# Patient Record
Sex: Female | Born: 1995 | Race: White | Hispanic: No | Marital: Single | Smoking: Never smoker
Health system: Southern US, Community
[De-identification: ages and names within clinical notes are randomized; demographics above are authoritative.]

## PROBLEM LIST (undated history)

## (undated) DIAGNOSIS — F329 Major depressive disorder, single episode, unspecified: Secondary | ICD-10-CM

## (undated) DIAGNOSIS — F32A Depression, unspecified: Secondary | ICD-10-CM

---
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
## 2016-09-06 NOTE — L&D Delivery Note (Signed)
Patient is 21 y.o. [id number] admitted for IOL for GHTN.   Delivery Note At [phone] a viable female was delivered via  SVD, Presentation: cephalic,LOA. APGAR: 8,9 ; weight pending.   Placenta status: spontaneous, intact. Cord: 3 vessel  Anesthesia:  epidural Episiotomy:  none Lacerations:  none Suture Repair: n/a Est. Blood Loss (mL): 600  Given methergine IM x1 and cytotec buccal x 1. Bleeding resolved and fundus firm.  Mom to postpartum.  Baby to Couplet care / Skin to Skin.  Rolm BookbinderAmber Tarius Stangelo, DO [facility]

## 2017-04-08 ENCOUNTER — Emergency Department (HOSPITAL_COMMUNITY)
Admission: EM | Admit: 2017-04-08 | Discharge: 2017-04-08 | Disposition: A | Discharge status: Home / Self Care | Payer: 59 | Attending: Emergency Medicine | Admitting: Emergency Medicine

## 2017-04-08 ENCOUNTER — Encounter (HOSPITAL_COMMUNITY): Payer: Self-pay

## 2017-04-08 ENCOUNTER — Emergency Department (HOSPITAL_COMMUNITY): Payer: 59

## 2017-04-08 DIAGNOSIS — B9689 Other specified bacterial agents as the cause of diseases classified elsewhere: Secondary | ICD-10-CM | POA: Diagnosis not present

## 2017-04-08 DIAGNOSIS — D72829 Elevated white blood cell count, unspecified: Secondary | ICD-10-CM | POA: Diagnosis not present

## 2017-04-08 DIAGNOSIS — R102 Pelvic and perineal pain: Secondary | ICD-10-CM

## 2017-04-08 DIAGNOSIS — O219 Vomiting of pregnancy, unspecified: Secondary | ICD-10-CM

## 2017-04-08 DIAGNOSIS — N390 Urinary tract infection, site not specified: Secondary | ICD-10-CM | POA: Insufficient documentation

## 2017-04-08 DIAGNOSIS — Z79899 Other long term (current) drug therapy: Secondary | ICD-10-CM | POA: Insufficient documentation

## 2017-04-08 DIAGNOSIS — D649 Anemia, unspecified: Secondary | ICD-10-CM | POA: Insufficient documentation

## 2017-04-08 DIAGNOSIS — N760 Acute vaginitis: Secondary | ICD-10-CM | POA: Diagnosis not present

## 2017-04-08 DIAGNOSIS — Z3A16 16 weeks gestation of pregnancy: Secondary | ICD-10-CM | POA: Insufficient documentation

## 2017-04-08 DIAGNOSIS — O26892 Other specified pregnancy related conditions, second trimester: Secondary | ICD-10-CM

## 2017-04-08 DIAGNOSIS — O26899 Other specified pregnancy related conditions, unspecified trimester: Secondary | ICD-10-CM

## 2017-04-08 LAB — CBC WITH DIFFERENTIAL/PLATELET
Basophils Absolute: 0 10*3/uL (ref 0.0–0.1)
Basophils Relative: 0 %
EOS ABS: 0.4 10*3/uL (ref 0.0–0.7)
Eosinophils Relative: 3 %
HEMATOCRIT: 34.5 % — AB (ref 36.0–46.0)
HEMOGLOBIN: 11.3 g/dL — AB (ref 12.0–15.0)
Lymphocytes Relative: 14 %
Lymphs Abs: 1.8 10*3/uL (ref 0.7–4.0)
MCH: 25.6 pg — AB (ref 26.0–34.0)
MCHC: 32.8 g/dL (ref 30.0–36.0)
MCV: 78.1 fL (ref 78.0–100.0)
MONO ABS: 1.2 10*3/uL — AB (ref 0.1–1.0)
MONOS PCT: 9 %
NEUTROS ABS: 9.7 10*3/uL — AB (ref 1.7–7.7)
NEUTROS PCT: 74 %
Platelets: 248 10*3/uL (ref 150–400)
RBC: 4.42 MIL/uL (ref 3.87–5.11)
RDW: 14.9 % (ref 11.5–15.5)
WBC: 13 10*3/uL — ABNORMAL HIGH (ref 4.0–10.5)

## 2017-04-08 LAB — COMPREHENSIVE METABOLIC PANEL
ALK PHOS: 63 U/L (ref 38–126)
ALT: 59 U/L — ABNORMAL HIGH (ref 14–54)
ANION GAP: 10 (ref 5–15)
AST: 41 U/L (ref 15–41)
Albumin: 3.2 g/dL — ABNORMAL LOW (ref 3.5–5.0)
BUN: 6 mg/dL (ref 6–20)
CALCIUM: 8.8 mg/dL — AB (ref 8.9–10.3)
CO2: 23 mmol/L (ref 22–32)
Chloride: 103 mmol/L (ref 101–111)
Creatinine, Ser: 0.5 mg/dL (ref 0.44–1.00)
GFR calc non Af Amer: >60 mL/min (ref >60)
GLUCOSE: 87 mg/dL (ref 65–99)
Potassium: 3.7 mmol/L (ref 3.5–5.1)
SODIUM: 136 mmol/L (ref 135–145)
TOTAL PROTEIN: 7.3 g/dL (ref 6.5–8.1)
Total Bilirubin: 0.1 mg/dL — ABNORMAL LOW (ref 0.3–1.2)

## 2017-04-08 LAB — URINALYSIS, ROUTINE W REFLEX MICROSCOPIC
BACTERIA UA: NONE SEEN
Bilirubin Urine: NEGATIVE
Glucose, UA: NEGATIVE mg/dL
Hgb urine dipstick: NEGATIVE
Ketones, ur: NEGATIVE mg/dL
Nitrite: NEGATIVE
PROTEIN: NEGATIVE mg/dL
Specific Gravity, Urine: 1.026 (ref 1.005–1.030)
pH: 5 (ref 5.0–8.0)

## 2017-04-08 LAB — WET PREP, GENITAL
Sperm: NONE SEEN
TRICH WET PREP: NONE SEEN
YEAST WET PREP: NONE SEEN

## 2017-04-08 LAB — LIPASE, BLOOD: Lipase: 18 U/L (ref 11–51)

## 2017-04-08 LAB — I-STAT BETA HCG BLOOD, ED (MC, WL, AP ONLY)

## 2017-04-08 LAB — OB RESULTS CONSOLE GC/CHLAMYDIA: CHLAMYDIA, DNA PROBE: POSITIVE

## 2017-04-08 LAB — HCG, QUANTITATIVE, PREGNANCY: HCG, BETA CHAIN, QUANT, S: 10369 m[IU]/mL — AB (ref <5)

## 2017-04-08 MED ORDER — CEPHALEXIN 500 MG PO CAPS: 500.0000 mg | ORAL_CAPSULE | Freq: Two times a day (BID) | ORAL | 0 refills | Status: AC

## 2017-04-08 MED ORDER — AZITHROMYCIN 250 MG PO TABS
1000.0000 mg | ORAL_TABLET | Freq: Once | ORAL | Status: AC
  Administered 2017-04-08: 1000 mg via ORAL
  Filled 2017-04-08: qty 4

## 2017-04-08 MED ORDER — METRONIDAZOLE 0.75 % VA GEL: 1.0000 | Freq: Every day | VAGINAL | 0 refills | Status: AC

## 2017-04-08 MED ORDER — DEXTROSE 5 % IV SOLN
1.0000 g | Freq: Once | INTRAVENOUS | Status: AC
  Administered 2017-04-08: 1 g via INTRAVENOUS
  Filled 2017-04-08: qty 10

## 2017-04-08 MED ORDER — PRENATAL COMPLETE 14-0.4 MG PO TABS: 1.0000 | ORAL_TABLET | Freq: Every day | ORAL | 2 refills | Status: AC

## 2017-04-08 NOTE — ED Triage Notes (Addendum)
Patient c/o bilateral lower abdominal pain x 1 week. Patient denies any vaginal discharge, vomiting, or fever. Patient does c/o intermittent nausea. Patient is approx [redacted] weeks pregnant.

## 2017-04-08 NOTE — Discharge Instructions (Signed)
Your work up today revealed that you may have a urinary tract infection; take antibiotic as directed, until completed; stay well hydrated. Your vaginal swab also revealed bacterial vaginosis, use vaginal cream as directed to treat this. The pain you're having could be from infection, or could be from round ligament pain which is pain of a ligament of your uterus that happens as the baby grows and things in your uterus stretch. It's important that you allow your pelvis to rest, so avoid sexual intercourse until the symptoms improve. Use tylenol as needed for pain. Stay very well hydrated and get plenty of rest. Start taking prenatal vitamins.   Also, you have been treated for gonorrhea and chlamydia in the ER but the hospital will call you if lab is positive. You were tested for HIV and Syphilis, and the hospital will call you if the lab is positive. NO SEXUAL INTERCOURSE FOR AT LEAST 10 DAYS AFTER TODAY'S VISIT, THIS WILL INVALIDATE YOUR TREATMENT HERE. DO NOT ENGAGE IN SEXUAL ACTIVITY UNTIL YOU FIND OUT ABOUT YOUR RESULTS AND HAVE PARTNERS TESTED AND TREATED. ALL PARTNERS MUST BE TESTED AND TREATED FOR STD'S. ALWAYS USE CONDOMS WHEN ENGAGING IN INTERCOURSE. [facility] for future [facility]D concerns or screenings.  Follow up with the women's outpatient clinic in 5-7 days for recheck of symptoms and to establish prenatal care. [facility]) for any changes or worsening symptoms.

## 2017-04-08 NOTE — ED Provider Notes (Signed)
[facility]-EMERGENCY DEPT Provider Note   CSN: [id number] Arrival date & time: 04/08/17  1514     History   Chief Complaint Chief Complaint  Patient presents with  . Abdominal Pain    [redacted] weeks pregnant    HPI Kaitlyn Bailey is a 21 y.o. G2P0010 female currently ~246w4d pregnant based on LMP of approximately 12/20/16, with no known PMHx, and no current prenatal or primary care, who presents to the ED with complaints of intermittent lower abdominal pain 1 week with associated intermittent nausea. Patient states that she does not currently have a primary or prenatal care established, and that she went to an urgent care a few weeks ago and was told that she was pregnant, at this point she states she's about [redacted] weeks pregnant. She noticed that over the last 1 week she has had intermittent lower abdominal pain, although currently denies any ongoing pain at this moment. She describes the pain as 10/10 intermittent sharp and pressure-like lower abdominal pain that sometimes radiates to the left lower back, worse with bending or leaning forward, and mildly improved with Tylenol and ibuprofen. She states that she also gets randomly nauseous intermittently, but not only in the mornings and with no seeming association to anything specific, and currently this is also not ongoing. She is sexually active with one female partner, unprotected. She denies fevers, chills, CP, SOB, vomiting, diarrhea/constipation, obstipation, melena, hematochezia, hematuria, dysuria, vaginal bleeding/discharge, genital sores, genital itching, myalgias, arthralgias, numbness, tingling, focal weakness, or any other complaints at this time. Denies recent travel, sick contacts, suspicious food intake, EtOH use, NSAID use, or prior abd surgeries. She has no known PMHx to her knowledge, but she doesn't follow with any PCP care so she isn't totally sure. She doesn't remember ever being told her BP was high.    The history is provided by the  patient and medical records. No language interpreter was used.  Abdominal Pain   This is a new problem. The current episode started more than 2 days ago. Episode frequency: intermittently. The problem has not changed since onset.The pain is associated with an unknown factor. The pain is located in the suprapubic region, RLQ and LLQ. The quality of the pain is pressure-like and sharp. The pain is at a severity of 10/10. The pain is moderate. Associated symptoms include nausea. Pertinent negatives include fever, diarrhea, hematochezia, melena, vomiting, constipation, dysuria, hematuria, arthralgias and myalgias. The symptoms are aggravated by certain positions. The symptoms are relieved by acetaminophen and NSAIDs.    History reviewed. No pertinent past medical history.  There are no active problems to display for this patient.   History reviewed. No pertinent surgical history.  OB History    Gravida Para Term Preterm AB Living   1             SAB TAB Ectopic Multiple Live Births                   Home Medications    Prior to Admission medications   Medication Sig Start Date End Date Taking? Authorizing Provider  acetaminophen-codeine (TYLENOL #3) 300-30 MG per tablet Take 1 tablet by mouth every 4 (four) hours as needed for pain. 05/25/12   Moreno-Coll, Adlih, MD  amoxicillin (AMOXIL) 500 MG capsule Take 2 capsules (1,000 mg total) by mouth 2 (two) times daily. 07/25/13   Rodolph Bongorey, Evan S, MD  ibuprofen (ADVIL,MOTRIN) 600 MG tablet Take 1 tablet (600 mg total) by mouth every 8 (eight) hours  as needed for pain. 05/25/12   Moreno-Coll, Adlih, MD    Family History No family history on file.  Social History Social History  Substance Use Topics  . Smoking status: Never Smoker  . Smokeless tobacco: Never Used  . Alcohol use No     Allergies   Patient has no known allergies.   Review of Systems Review of Systems  Constitutional: Negative for chills and fever.  Respiratory:  Negative for shortness of breath.   Cardiovascular: Negative for chest pain.  Gastrointestinal: Positive for abdominal pain and nausea. Negative for blood in stool, constipation, diarrhea, hematochezia, melena and vomiting.  Genitourinary: Negative for dysuria, genital sores, hematuria, vaginal bleeding, vaginal discharge and vaginal pain.  Musculoskeletal: Negative for arthralgias and myalgias.  Skin: Negative for color change.  Allergic/Immunologic: Negative for immunocompromised state.  Neurological: Negative for weakness and numbness.  Psychiatric/Behavioral: Negative for confusion.   All other systems reviewed and are negative for acute change except as noted in the HPI.    Physical Exam Updated Vital Signs BP (S) 131/76   Pulse (S) (!) 102   Temp 98.8 F (37.1 C) (Oral)   Resp (S) 18   Ht 5\' 4"  (1.626 m)   Wt 117.9 kg (260 lb)   SpO2 (S) 100%   BMI 44.63 kg/m    Physical Exam  Constitutional: She is oriented to person, place, and time. She appears well-developed and well-nourished.  Non-toxic appearance. No distress.  Afebrile, nontoxic, NAD, obese female; BP slightly elevated 140s/40s on my recheck; mild tachycardia in low 100s  HENT:  Head: Normocephalic and atraumatic.  Mouth/Throat: Oropharynx is clear and moist and mucous membranes are normal.  Eyes: Conjunctivae and EOM are normal. Right eye exhibits no discharge. Left eye exhibits no discharge.  Neck: Normal range of motion. Neck supple.  Cardiovascular: Regular rhythm, normal heart sounds and intact distal pulses.  Tachycardia present.  Exam reveals no gallop and no friction rub.   No murmur heard. HR low 100s  Pulmonary/Chest: Effort normal and breath sounds normal. No respiratory distress. She has no decreased breath sounds. She has no wheezes. She has no rhonchi. She has no rales.  Abdominal: Soft. Normal appearance and bowel sounds are normal. She exhibits no distension. There is tenderness in the right lower  quadrant, suprapubic area and left lower quadrant. There is no rigidity, no rebound, no guarding, no CVA tenderness, no tenderness at McBurney's point and negative Murphy's sign.  Soft, obese which significantly limits exam but no obvious distension, no definite gravid uterus but hard to adequately assess due to body habitus; +BS throughout, with mild lower abd TTP L>R, no r/g/r, neg murphy's, neg mcburney's, no CVA TTP   Genitourinary: Uterus normal. Pelvic exam was performed with patient supine. There is no rash, tenderness or lesion on the right labia. There is no rash, tenderness or lesion on the left labia. Cervix exhibits discharge and friability. Cervix exhibits no motion tenderness. Right adnexum displays no mass, no tenderness and no fullness. Left adnexum displays tenderness. Left adnexum displays no mass and no fullness. No erythema, tenderness or bleeding in the vagina. Vaginal discharge found.  Genitourinary Comments: Chaperone present for exam. No rashes, lesions, or tenderness to external genitalia. No erythema, injury, or tenderness to vaginal mucosa. Mild amount of mucopurulent vaginal discharge without bleeding within vaginal vault. No adnexal masses or fullness, but with mild L adnexal TTP. No CMT, but mild cervical friability and mucopurulent discharge from cervical os. Cervical os is closed.  Uterus non-deviated, mobile, nonTTP, and without definite enlargement however body habitus significantly limits this portion of the exam.   Musculoskeletal: Normal range of motion.  Neurological: She is alert and oriented to person, place, and time. She has normal strength. No sensory deficit.  Skin: Skin is warm, dry and intact. No rash noted.  Psychiatric: She has a normal mood and affect.  Nursing note and vitals reviewed.    ED Treatments / Results  Labs (all labs ordered are listed, but only abnormal results are displayed) Labs Reviewed  WET PREP, GENITAL - Abnormal; Notable for the  following:       Result Value   Clue Cells Wet Prep HPF POC PRESENT (*)    WBC, Wet Prep HPF POC MANY (*)    All other components within normal limits  CBC WITH DIFFERENTIAL/PLATELET - Abnormal; Notable for the following:    WBC 13.0 (*)    Hemoglobin 11.3 (*)    HCT 34.5 (*)    MCH 25.6 (*)    Neutro Abs 9.7 (*)    Monocytes Absolute 1.2 (*)    All other components within normal limits  COMPREHENSIVE METABOLIC PANEL - Abnormal; Notable for the following:    Calcium 8.8 (*)    Albumin 3.2 (*)    ALT 59 (*)    Total Bilirubin 0.1 (*)    All other components within normal limits  URINALYSIS, ROUTINE W REFLEX MICROSCOPIC - Abnormal; Notable for the following:    APPearance CLOUDY (*)    Leukocytes, UA LARGE (*)    Squamous Epithelial / LPF 6-30 (*)    All other components within normal limits  HCG, QUANTITATIVE, PREGNANCY - Abnormal; Notable for the following:    hCG, Beta Chain, Quant, S 10,369 (*)    All other components within normal limits  I-STAT BETA HCG BLOOD, ED ([facility], [facility], [facility] ONLY) - Abnormal; Notable for the following:    I-stat hCG, quantitative >2,000.0 (*)    All other components within normal limits  URINE CULTURE  LIPASE, BLOOD  RPR  HIV ANTIBODY (ROUTINE TESTING)  GC/CHLAMYDIA PROBE AMP ([employer]) NOT AT [facility]    EKG  EKG Interpretation None       Radiology [place] Ob Limited  Result Date: 04/08/2017 CLINICAL DATA:  Pelvic pain and cramping for 1 week. Fifteen weeks and 4 days pregnant by last menstrual period. EXAM: LIMITED TRANSABDOMINAL AND TRANSVAGINAL OBSTETRIC ULTRASOUND AND DOPPLER FINDINGS: Number of Fetuses: 1 Heart Rate:  149 bpm Movement: Visualized Presentation: Breech Placental Location: Anterior Previa: No Amniotic Fluid (Subjective):  Within normal limits. BPD:  3.9cm 17w  6d MATERNAL FINDINGS: Cervix:  Appears closed. Uterus/Adnexae: No abnormality visualized. Right ovarian corpus luteum cyst. Pulsed Doppler evaluation of both ovaries demonstrates  normal low-resistance arterial and venous waveforms. Other findings No free fluid. IMPRESSION: Single live intrauterine gestation with an estimated gestational age by BPD of 17 weeks and 6 days. No complicating features. This exam is performed on an emergent basis and does not comprehensively evaluate fetal size, dating, or anatomy; follow-up complete OB US should be considered if further fetal assessment is warranted. Electronically Signed   By: Beckie SaltsSteven  Reid M.D.   On: 04/08/2017 20:48   [place] Ob Transvaginal  Result Date: 04/08/2017 CLINICAL DATA:  Pelvic pain and cramping for 1 week. Fifteen weeks and 4 days pregnant by last menstrual period. EXAM: LIMITED TRANSABDOMINAL AND TRANSVAGINAL OBSTETRIC ULTRASOUND AND DOPPLER FINDINGS: Number of Fetuses: 1 Heart Rate:  149 bpm Movement:  Visualized Presentation: Breech Placental Location: Anterior Previa: No Amniotic Fluid (Subjective):  Within normal limits. BPD:  3.9cm 17w  6d MATERNAL FINDINGS: Cervix:  Appears closed. Uterus/Adnexae: No abnormality visualized. Right ovarian corpus luteum cyst. Pulsed Doppler evaluation of both ovaries demonstrates normal low-resistance arterial and venous waveforms. Other findings No free fluid. IMPRESSION: Single live intrauterine gestation with an estimated gestational age by BPD of 17 weeks and 6 days. No complicating features. This exam is performed on an emergent basis and does not comprehensively evaluate fetal size, dating, or anatomy; follow-up complete OB US should be considered if further fetal assessment is warranted. Electronically Signed   By: Beckie Salts M.D.   On: 04/08/2017 20:48   [place] Art/ven Flow Abd Pelv Doppler  Result Date: 04/08/2017 CLINICAL DATA:  Pelvic pain and cramping for 1 week. Fifteen weeks and 4 days pregnant by last menstrual period. EXAM: LIMITED TRANSABDOMINAL AND TRANSVAGINAL OBSTETRIC ULTRASOUND AND DOPPLER FINDINGS: Number of Fetuses: 1 Heart Rate:  149 bpm Movement: Visualized Presentation:  Breech Placental Location: Anterior Previa: No Amniotic Fluid (Subjective):  Within normal limits. BPD:  3.9cm 17w  6d MATERNAL FINDINGS: Cervix:  Appears closed. Uterus/Adnexae: No abnormality visualized. Right ovarian corpus luteum cyst. Pulsed Doppler evaluation of both ovaries demonstrates normal low-resistance arterial and venous waveforms. Other findings No free fluid. IMPRESSION: Single live intrauterine gestation with an estimated gestational age by BPD of 17 weeks and 6 days. No complicating features. This exam is performed on an emergent basis and does not comprehensively evaluate fetal size, dating, or anatomy; follow-up complete OB US should be considered if further fetal assessment is warranted. Electronically Signed   By: Beckie Salts M.D.   On: 04/08/2017 20:48    Procedures Procedures (including critical care time)  Medications Ordered in ED Medications  azithromycin (ZITHROMAX) tablet 1,000 mg (1,000 mg Oral Given 04/08/17 1953)  cefTRIAXone (ROCEPHIN) 1 g in dextrose 5 % 50 mL IVPB (0 g Intravenous Stopped 04/08/17 2025)     Initial Impression / Assessment and Plan / ED Course  I have reviewed the triage vital signs and the nursing notes.  Pertinent labs & imaging results that were available during my care of the patient were reviewed by me and considered in my medical decision making (see chart for details).     21 y.o. female here with intermittent lower abd pain x1wk, currently ~[redacted]w[redacted]d pregnant by approximate LMP. No prenatal care yet. On exam, obese female which slightly limits exam, mild lower abd TTP L>R but nonperitoneal and hard to say whether her uterus is palpable due to body habitus. BP 147/40 on my recheck, and 130s/70s on second recheck; HR mildly elevated in the low 100s. Neg murphy's exam, neg mcburney's point tenderness. Will get labs, STD check, and perform pelvic exam, then likely get pelvic U/S. Pt declined wanting anything for pain or nausea. Will reassess  shortly.   7:38 PM CBC w/diff with mild leukocytosis and mild anemia. Remainder of labs pending. Pelvic exam reveals mild amount of mucopurulent discharge from the cervix, mild cervical friability, no CMT, mild L adnexal TTP; will empirically cover for GC/CT, and proceed with U/S. Will reassess shortly.   10:54 PM CMP essentially unremarkable. Lipase WNL. U/A with large leuks, nitrite neg, TNTC WBC, but no bacteria and 6-30 squamous so it's highly likely that it's just contaminated, however given lower abd pain, and the fact that she's pregnant, will treat empirically; UCx sent. Quant BetaHCG 10,369. Wet prep showing +clue cells and many  WBCs but otherwise neg for yeast/trich; will treat for BV given her discharge. U/S reveals single live IUP with EGA [redacted]w[redacted]d, no other concerning findings. Overall, it's possible this is round ligament pain, vs UTI vs BV/infection. Will empirically cover for UTI and BV. Advised tylenol use, pelvic rest, stay hydrated, abstinence until STD testing returns, and start prenatals; f/up with women's clinic for ongoing prenatal care and recheck of symptoms in 5-7 days. Strict return precautions advised. I explained the diagnosis and have given explicit precautions to return to the ER including for any other new or worsening symptoms. The patient understands and accepts the medical plan as it's been dictated and I have answered their questions. Discharge instructions concerning home care and prescriptions have been given. The patient is STABLE and is discharged to home in good condition.    Final Clinical Impressions(s) / ED Diagnoses   Final diagnoses:  Left adnexal tenderness  Pelvic pain in pregnancy, antepartum, second trimester  Nausea/vomiting in pregnancy  Anemia, unspecified type  Leukocytosis, unspecified type  BV (bacterial vaginosis)  Lower urinary tract infectious disease    New Prescriptions New Prescriptions   CEPHALEXIN (KEFLEX) 500 MG CAPSULE    Take 1  capsule (500 mg total) by mouth 2 (two) times daily. x 7 days   METRONIDAZOLE (METROGEL VAGINAL) 0.75 % VAGINAL GEL    Place 1 Applicatorful vaginally at bedtime. x7 days   PRENATAL VIT-FE FUMARATE-FA (PRENATAL COMPLETE) 14-0.4 MG TABS    Take 1 tablet by mouth daily after breakfast.     [address] [place], PA-C 04/08/17 2300    Nira Conn, MD 04/09/17 [phone]

## 2017-04-08 NOTE — ED Notes (Signed)
EDPA Provider at bedside. 

## 2017-04-09 LAB — HIV ANTIBODY (ROUTINE TESTING W REFLEX): HIV SCREEN 4TH GENERATION: NONREACTIVE

## 2017-04-09 LAB — RPR: RPR Ser Ql: NONREACTIVE

## 2017-04-10 LAB — URINE CULTURE

## 2017-04-11 LAB — GC/CHLAMYDIA PROBE AMP (~~LOC~~) NOT AT ARMC
CHLAMYDIA, DNA PROBE: NEGATIVE
NEISSERIA GONORRHEA: POSITIVE — AB

## 2017-06-20 LAB — OB RESULTS CONSOLE RUBELLA ANTIBODY, IGM: RUBELLA: IMMUNE

## 2017-06-20 LAB — OB RESULTS CONSOLE GC/CHLAMYDIA
Chlamydia: NEGATIVE
Gonorrhea: NEGATIVE

## 2017-06-20 LAB — OB RESULTS CONSOLE VARICELLA ZOSTER ANTIBODY, IGG: VARICELLA IGG: IMMUNE

## 2017-07-15 ENCOUNTER — Other Ambulatory Visit (HOSPITAL_COMMUNITY): Payer: Self-pay | Admitting: Nurse Practitioner

## 2017-07-15 DIAGNOSIS — Z3689 Encounter for other specified antenatal screening: Secondary | ICD-10-CM

## 2017-07-18 ENCOUNTER — Ambulatory Visit (HOSPITAL_COMMUNITY)
Admission: RE | Admit: 2017-07-18 | Discharge: 2017-07-18 | Disposition: A | Discharge status: Home / Self Care | Payer: 59 | Source: Ambulatory Visit | Attending: Nurse Practitioner | Admitting: Nurse Practitioner

## 2017-07-18 DIAGNOSIS — Z363 Encounter for antenatal screening for malformations: Secondary | ICD-10-CM | POA: Insufficient documentation

## 2017-07-18 DIAGNOSIS — Z3A32 32 weeks gestation of pregnancy: Secondary | ICD-10-CM | POA: Diagnosis not present

## 2017-07-18 DIAGNOSIS — Z3689 Encounter for other specified antenatal screening: Secondary | ICD-10-CM

## 2017-08-18 LAB — OB RESULTS CONSOLE GBS: STREP GROUP B AG: NEGATIVE

## 2017-08-25 ENCOUNTER — Encounter (HOSPITAL_COMMUNITY): Payer: Self-pay

## 2017-08-25 ENCOUNTER — Other Ambulatory Visit: Payer: Self-pay

## 2017-08-25 ENCOUNTER — Inpatient Hospital Stay (HOSPITAL_COMMUNITY)
Admission: AD | Admit: 2017-08-25 | Discharge: 2017-08-29 | DRG: 807 | Disposition: A | Discharge status: Home / Self Care | Payer: 59 | Source: Ambulatory Visit | Attending: Obstetrics and Gynecology | Admitting: Obstetrics and Gynecology

## 2017-08-25 DIAGNOSIS — O134 Gestational [pregnancy-induced] hypertension without significant proteinuria, complicating childbirth: Secondary | ICD-10-CM | POA: Diagnosis present

## 2017-08-25 DIAGNOSIS — Z3A37 37 weeks gestation of pregnancy: Secondary | ICD-10-CM

## 2017-08-25 DIAGNOSIS — O99214 Obesity complicating childbirth: Secondary | ICD-10-CM | POA: Diagnosis present

## 2017-08-25 DIAGNOSIS — O133 Gestational [pregnancy-induced] hypertension without significant proteinuria, third trimester: Secondary | ICD-10-CM

## 2017-08-25 HISTORY — DX: Depression, unspecified: F32.A

## 2017-08-25 HISTORY — DX: Major depressive disorder, single episode, unspecified: F32.9

## 2017-08-25 LAB — PROTEIN / CREATININE RATIO, URINE
Creatinine, Urine: 132 mg/dL
PROTEIN CREATININE RATIO: 0.08 mg/mg{creat} (ref 0.00–0.15)
TOTAL PROTEIN, URINE: 10 mg/dL

## 2017-08-25 LAB — TYPE AND SCREEN
ABO/RH(D): B POS
Antibody Screen: NEGATIVE

## 2017-08-25 LAB — COMPREHENSIVE METABOLIC PANEL
ALK PHOS: 100 U/L (ref 38–126)
ALT: 25 U/L (ref 14–54)
ANION GAP: 10 (ref 5–15)
AST: 26 U/L (ref 15–41)
Albumin: 2.5 g/dL — ABNORMAL LOW (ref 3.5–5.0)
BILIRUBIN TOTAL: 0.5 mg/dL (ref 0.3–1.2)
BUN: 6 mg/dL (ref 6–20)
CALCIUM: 8.7 mg/dL — AB (ref 8.9–10.3)
CO2: 20 mmol/L — AB (ref 22–32)
CREATININE: 0.49 mg/dL (ref 0.44–1.00)
Chloride: 108 mmol/L (ref 101–111)
Glucose, Bld: 79 mg/dL (ref 65–99)
Potassium: 3.7 mmol/L (ref 3.5–5.1)
SODIUM: 138 mmol/L (ref 135–145)
TOTAL PROTEIN: 6.2 g/dL — AB (ref 6.5–8.1)

## 2017-08-25 LAB — CBC
HEMATOCRIT: 35.8 % — AB (ref 36.0–46.0)
HEMOGLOBIN: 11.5 g/dL — AB (ref 12.0–15.0)
MCH: 25.5 pg — AB (ref 26.0–34.0)
MCHC: 32.1 g/dL (ref 30.0–36.0)
MCV: 79.4 fL (ref 78.0–100.0)
Platelets: 268 10*3/uL (ref 150–400)
RBC: 4.51 MIL/uL (ref 3.87–5.11)
RDW: 16.6 % — ABNORMAL HIGH (ref 11.5–15.5)
WBC: 13.2 10*3/uL — ABNORMAL HIGH (ref 4.0–10.5)

## 2017-08-25 LAB — URINALYSIS, ROUTINE W REFLEX MICROSCOPIC
Bilirubin Urine: NEGATIVE
GLUCOSE, UA: NEGATIVE mg/dL
Hgb urine dipstick: NEGATIVE
KETONES UR: NEGATIVE mg/dL
Leukocytes, UA: NEGATIVE
Nitrite: NEGATIVE
PH: 6 (ref 5.0–8.0)
Protein, ur: NEGATIVE mg/dL
SPECIFIC GRAVITY, URINE: 1.014 (ref 1.005–1.030)

## 2017-08-25 LAB — ABO/RH: ABO/RH(D): B POS

## 2017-08-25 MED ORDER — ONDANSETRON HCL 4 MG/2ML IJ SOLN: 4.0000 mg | Freq: Four times a day (QID) | INTRAMUSCULAR | Status: DC | PRN

## 2017-08-25 MED ORDER — DIPHENHYDRAMINE HCL 50 MG/ML IJ SOLN: 12.5000 mg | INTRAMUSCULAR | Status: DC | PRN

## 2017-08-25 MED ORDER — ACETAMINOPHEN 325 MG PO TABS: 650.0000 mg | ORAL_TABLET | ORAL | Status: DC | PRN

## 2017-08-25 MED ORDER — OXYTOCIN 40 UNITS IN LACTATED RINGERS INFUSION - SIMPLE MED
1.0000 m[IU]/min | INTRAVENOUS | Status: DC
  Administered 2017-08-25: 2 m[IU]/min via INTRAVENOUS
  Filled 2017-08-25: qty 1000

## 2017-08-25 MED ORDER — FENTANYL 2.5 MCG/ML BUPIVACAINE 1/10 % EPIDURAL INFUSION (WH - ANES)
14.0000 mL/h | INTRAMUSCULAR | Status: DC | PRN
  Administered 2017-08-26 – 2017-08-27 (×3): 14 mL/h via EPIDURAL
  Filled 2017-08-25 (×2): qty 100

## 2017-08-25 MED ORDER — LACTATED RINGERS IV SOLN: 500.0000 mL | Freq: Once | INTRAVENOUS | Status: DC

## 2017-08-25 MED ORDER — LIDOCAINE HCL (PF) 1 % IJ SOLN
30.0000 mL | INTRAMUSCULAR | Status: DC | PRN
  Filled 2017-08-25: qty 30

## 2017-08-25 MED ORDER — LACTATED RINGERS IV SOLN: 500.0000 mL | INTRAVENOUS | Status: DC | PRN

## 2017-08-25 MED ORDER — LACTATED RINGERS IV SOLN
INTRAVENOUS | Status: DC
  Administered 2017-08-25 – 2017-08-26 (×3): via INTRAVENOUS

## 2017-08-25 MED ORDER — SOD CITRATE-CITRIC ACID 500-334 MG/5ML PO SOLN: 30.0000 mL | ORAL | Status: DC | PRN

## 2017-08-25 MED ORDER — PHENYLEPHRINE 40 MCG/ML (10ML) SYRINGE FOR IV PUSH (FOR BLOOD PRESSURE SUPPORT)
80.0000 ug | PREFILLED_SYRINGE | INTRAVENOUS | Status: DC | PRN
  Filled 2017-08-25: qty 10

## 2017-08-25 MED ORDER — OXYCODONE-ACETAMINOPHEN 5-325 MG PO TABS: 1.0000 | ORAL_TABLET | ORAL | Status: DC | PRN

## 2017-08-25 MED ORDER — TERBUTALINE SULFATE 1 MG/ML IJ SOLN: 0.2500 mg | Freq: Once | INTRAMUSCULAR | Status: DC | PRN

## 2017-08-25 MED ORDER — OXYTOCIN 40 UNITS IN LACTATED RINGERS INFUSION - SIMPLE MED
2.5000 [IU]/h | INTRAVENOUS | Status: DC
  Administered 2017-08-27: 2.5 [IU]/h via INTRAVENOUS
  Filled 2017-08-25: qty 1000

## 2017-08-25 MED ORDER — MISOPROSTOL 25 MCG QUARTER TABLET: 25.0000 ug | ORAL_TABLET | ORAL | Status: DC | PRN

## 2017-08-25 MED ORDER — OXYTOCIN BOLUS FROM INFUSION
500.0000 mL | Freq: Once | INTRAVENOUS | Status: AC
  Administered 2017-08-27: 500 mL via INTRAVENOUS

## 2017-08-25 MED ORDER — FLEET ENEMA 7-19 GM/118ML RE ENEM: 1.0000 | ENEMA | RECTAL | Status: DC | PRN

## 2017-08-25 MED ORDER — MISOPROSTOL 200 MCG PO TABS
ORAL_TABLET | ORAL | Status: AC
  Filled 2017-08-25: qty 1

## 2017-08-25 MED ORDER — OXYCODONE-ACETAMINOPHEN 5-325 MG PO TABS: 2.0000 | ORAL_TABLET | ORAL | Status: DC | PRN

## 2017-08-25 MED ORDER — MISOPROSTOL 50MCG HALF TABLET
50.0000 ug | ORAL_TABLET | ORAL | Status: DC | PRN
  Administered 2017-08-25: 50 ug via ORAL
  Filled 2017-08-25: qty 1

## 2017-08-25 NOTE — MAU Note (Signed)
Pt presents [facility] For [facility] appointment, was sent over for elevated blood pressures, blurred and mild epigastric pain. Pt denies vaginal bleeding and LOF. +FM

## 2017-08-25 NOTE — Progress Notes (Signed)
Vitals:   08/25/17 1900 08/25/17 1939  BP: 127/90 120/63  Pulse: 100 97  Resp: 16   Temp:  99 F (37.2 C)  SpO2:     Foley out, pt will eat light meal then start pitocin . cx 4/thick. FHR Cat 1; occ ctx

## 2017-08-25 NOTE — MAU Provider Note (Signed)
Chief Complaint:  Blurred Vision   First Provider Initiated Contact with Patient 08/25/17 1321     HPI  HPI: Kaitlyn Bailey is a 21 y.o. G2P0010 at 6535w2dwho presents [facility]. Had elevated BP this morning of 140s/90s. States she has had a few elevated BPs in the office since her initial OB visit at 26 weeks. Denies history of hypertension. Denies headache. Reports mild blurred vision for the last week & intermittent epigastric soreness.  Denies abdominal pain. Positive fetal movement.    Past Medical History: Past Medical History:  Diagnosis Date  . Depression     Past obstetric history: OB History  Gravida Para Term Preterm AB Living  2       1    SAB TAB Ectopic Multiple Live Births               # Outcome Date GA Lbr Len/2nd Weight Sex Delivery Anes PTL Lv  2 Current           1 AB               Past Surgical History: History reviewed. No pertinent surgical history.  Family History: History reviewed. No pertinent family history.  Social History: Social History   Tobacco Use  . Smoking status: Never Smoker  . Smokeless tobacco: Never Used  Substance Use Topics  . Alcohol use: No  . Drug use: No    Allergies: No Known Allergies  Meds:  Medications Prior to Admission  Medication Sig Dispense Refill Last Dose  . acetaminophen (TYLENOL) 500 MG tablet Take 500 mg by mouth every 6 (six) hours as needed for moderate pain.   04/06/2017 at unknown time  . acetaminophen-codeine (TYLENOL #3) 300-30 MG per tablet Take 1 tablet by mouth every 4 (four) hours as needed for pain. (Patient not taking: Reported on 04/08/2017) 20 tablet 0 Completed Course at Unknown time  . amoxicillin (AMOXIL) 500 MG capsule Take 2 capsules (1,000 mg total) by mouth 2 (two) times daily. (Patient not taking: Reported on 04/08/2017) 40 capsule 0 Completed Course at Unknown time  . ibuprofen (ADVIL,MOTRIN) 200 MG tablet Take 400 mg  by mouth every 6 (six) hours as needed for moderate pain.   Past Week at Unknown time  . ibuprofen (ADVIL,MOTRIN) 600 MG tablet Take 1 tablet (600 mg total) by mouth every 8 (eight) hours as needed for pain. (Patient not taking: Reported on 04/08/2017) 30 tablet 0 Completed Course at Unknown time  . Prenatal Vit-Fe Fumarate-FA (PRENATAL COMPLETE) 14-0.4 MG TABS Take 1 tablet by mouth daily after breakfast. 30 each 2     I have reviewed patient's Past Medical Hx, Surgical Hx, Family Hx, Social Hx, medications and allergies.   ROS:  Review of Systems  Constitutional: Negative.   Eyes: Positive for visual disturbance.  Gastrointestinal: Negative.   Neurological: Negative.    Other systems negative  Physical Exam   Patient Vitals for the past 24 hrs:  BP Temp Temp src Pulse Resp SpO2 Height Weight  08/25/17 1429 128/76 - - 98 - - - -  08/25/17 1416 (!) 141/80 - - 96 - - - -  08/25/17 1401 136/78 - - 97 - - - -  08/25/17 1346 126/76 - - (!) 112 - - - -  08/25/17 1331 133/75 - - 99 - - - -  08/25/17 1316 (!) 131/57 - - (!) 108 - - - -  08/25/17 1303 - - - - -  98 % - -  08/25/17 1301 124/84 - - (!) 109 - - - -  08/25/17 1300 125/78 98.7 F (37.1 C) Oral (!) 110 18 - 5\' 4"  (1.626 m) 300 lb (136.1 kg)   Constitutional: Well-developed, well-nourished female in no acute distress.  Cardiovascular: normal rate and rhythm Respiratory: normal effort, clear to auscultation bilaterally GI: Abd soft, non-tender, gravid appropriate for gestational age.   No rebound or guarding. MS: Extremities nontender, BLE edema, normal ROM Neurologic: Alert and oriented x 4. Bilateral patellar DTR 1+, no clonus     FHT:  Baseline 135 , moderate variability, accelerations present, no decelerations Contractions: Rare   Labs: Results for orders placed or performed during the hospital encounter of 08/25/17 (from the past 24 hour(s))  Urinalysis, Routine w reflex microscopic     Status: None   Collection Time:  08/25/17 12:48 PM  Result Value Ref Range   Color, Urine YELLOW YELLOW   APPearance CLEAR CLEAR   Specific Gravity, Urine 1.014 1.005 - 1.030   pH 6.0 5.0 - 8.0   Glucose, UA NEGATIVE NEGATIVE mg/dL   Hgb urine dipstick NEGATIVE NEGATIVE   Bilirubin Urine NEGATIVE NEGATIVE   Ketones, ur NEGATIVE NEGATIVE mg/dL   Protein, ur NEGATIVE NEGATIVE mg/dL   Nitrite NEGATIVE NEGATIVE   Leukocytes, UA NEGATIVE NEGATIVE  Protein / creatinine ratio, urine     Status: None   Collection Time: 08/25/17 12:48 PM  Result Value Ref Range   Creatinine, Urine 132.00 mg/dL   Total Protein, Urine 10 mg/dL   Protein Creatinine Ratio 0.08 0.00 - 0.15 mg/mg[Cre]  CBC     Status: Abnormal   Collection Time: 08/25/17  1:31 PM  Result Value Ref Range   WBC 13.2 (H) 4.0 - 10.5 K/uL   RBC 4.51 3.87 - 5.11 MIL/uL   Hemoglobin 11.5 (L) 12.0 - 15.0 g/dL   HCT 16.1 (L) 09.6 - 04.5 %   MCV 79.4 78.0 - 100.0 fL   MCH 25.5 (L) 26.0 - 34.0 pg   MCHC 32.1 30.0 - 36.0 g/dL   RDW 40.9 (H) 81.1 - 91.4 %   Platelets 268 150 - 400 K/uL  Comprehensive metabolic panel     Status: Abnormal   Collection Time: 08/25/17  1:31 PM  Result Value Ref Range   Sodium 138 135 - 145 mmol/L   Potassium 3.7 3.5 - 5.1 mmol/L   Chloride 108 101 - 111 mmol/L   CO2 20 (L) 22 - 32 mmol/L   Glucose, Bld 79 65 - 99 mg/dL   BUN 6 6 - 20 mg/dL   Creatinine, Ser 7.82 0.44 - 1.00 mg/dL   Calcium 8.7 (L) 8.9 - 10.3 mg/dL   Total Protein 6.2 (L) 6.5 - 8.1 g/dL   Albumin 2.5 (L) 3.5 - 5.0 g/dL   AST 26 15 - 41 U/L   ALT 25 14 - 54 U/L   Alkaline Phosphatase 100 38 - 126 U/L   Total Bilirubin 0.5 0.3 - 1.2 mg/dL   GFR calc non Af Amer >60 >60 mL/min   GFR calc Af Amer >60 >60 mL/min   Anion gap 10 5 - 15      Imaging:  No results found.  MAU Course/MDM: Cycle BPs, elevated x 1; none severe range Per patient; we have incorrect EDD in computer --- verified per ultrasound in August & prenatal records; best dating based on 17 wk  ultrasound makes her [redacted]w[redacted]d.  C/w Dr. Adrian Blackwater. Will admit &  induce for gestational hypertension.   Limited bedside ultrasound performed for presentation --- vertex Assessment: 1. Gestational hypertension, third trimester   2. [redacted] weeks gestation of pregnancy     Plan: Admit to birthing suites GBS negative [facility] CN[facility]  Judeth HornErin Oumou Smead, [place] 08/25/2017 1:21 PM

## 2017-08-25 NOTE — Anesthesia Pain Management Evaluation Note (Signed)
  [facility] Pain Management Visit Note  Patient: Kaitlyn Bailey, 21 y.o., female  "Hello I am a member of the anesthesia team at [facility]. We have an anesthesia team available at all times to provide care throughout the hospital, including epidural management and anesthesia for C-section. I don't know your plan for the delivery whether it a natural birth, water birth, IV sedation, nitrous supplementation, doula or epidural, but we want to meet your pain goals."   1.Was your pain managed to your expectations on prior hospitalizations?   No prior hospitalizations  2.What is your expectation for pain management during this hospitalization?     Epidural  3.How can we help you reach that goal? Epidural if desired  Record the patient's initial score and the patient's pain goal.   Pain: 0  Pain Goal: 5 [facility] wants you to be able to say your pain was always managed very well.  Kaitlyn Bailey 08/25/2017

## 2017-08-25 NOTE — H&P (Signed)
OBSTETRIC ADMISSION HISTORY AND PHYSICAL  Kaitlyn HackerVictoria Kumari Bailey is a 21 y.o. female G2P0010 with IUP at 6668w5d by second trimester u/s presenting for IOL for gestational HTN. She reports +FMs, No LOF, no VB, no blurry vision, headaches or peripheral edema, and RUQ pain.  She plans on breast feeding. She request IUD for birth control. [facility]   Dating: By second trimester u/s --->  Estimated Date of Delivery: 09/10/17  Prenatal History/Complications:  Past Medical History: Past Medical History:  Diagnosis Date  . Depression     Past Surgical History: History reviewed. No pertinent surgical history.  Obstetrical History: OB History    Gravida Para Term Preterm AB Living   2       1     SAB TAB Ectopic Multiple Live Births                  Social History: Social History   Socioeconomic History  . Marital status: Single    Spouse name: None  . Number of children: None  . Years of education: None  . Highest education level: None  Social Needs  . Financial resource strain: None  . Food insecurity - worry: None  . Food insecurity - inability: None  . Transportation needs - medical: None  . Transportation needs - non-medical: None  Occupational History  . None  Tobacco Use  . Smoking status: Never Smoker  . Smokeless tobacco: Never Used  Substance and Sexual Activity  . Alcohol use: No  . Drug use: No  . Sexual activity: Yes  Other Topics Concern  . None  Social History Narrative  . None    Family History: History reviewed. No pertinent family history.  Allergies: No Known Allergies  Medications Prior to Admission  Medication Sig Dispense Refill Last Dose  . calcium carbonate (TUMS - DOSED IN MG ELEMENTAL CALCIUM) 500 MG chewable tablet Chew 1 tablet by mouth 2 (two) times daily as needed for indigestion or heartburn.   Past Week at Unknown time  . Prenatal Vit-Fe Fumarate-FA (PRENATAL COMPLETE) 14-0.4 MG TABS Take 1 tablet by  mouth daily after breakfast. 30 each 2 08/25/2017 at Unknown time  . acetaminophen (TYLENOL) 500 MG tablet Take 500 mg by mouth every 6 (six) hours as needed for moderate pain.   prn  . acetaminophen-codeine (TYLENOL #3) 300-30 MG per tablet Take 1 tablet by mouth every 4 (four) hours as needed for pain. (Patient not taking: Reported on 04/08/2017) 20 tablet 0 Completed Course at Unknown time   Review of Systems   All systems reviewed and negative except as stated in HPI  Blood pressure (!) 150/79, pulse 98, temperature 98.6 F (37 C), temperature source Oral, resp. rate 16, height 5\' 4"  (1.626 m), weight 300 lb (136.1 kg), SpO2 98 %. General appearance: alert, cooperative and no distress Lungs: clear to auscultation bilaterally Heart: regular rate and rhythm Abdomen: soft, non-tender; bowel sounds normal Pelvic: n/a Extremities: Homans sign is negative, no sign of DVT DTR's +2 Presentation: cephalic Fetal monitoringBaseline: 130 bpm, Variability: Good {> 6 bpm), Accelerations: Reactive and Decelerations: Absent Uterine activityNone     Prenatal labs: ABO, Rh: --/--/B POS (12/20 1331) Antibody: NEG (12/20 1331) Rubella: Immune (10/15 0000) RPR: Non Reactive (08/03 1858)  HBsAg:    HIV:    GBS:   negative  Prenatal Transfer Tool  Maternal Diabetes: No Genetic Screening: Normal Maternal Ultrasounds/Referrals: Normal Fetal Ultrasounds or other Referrals:  None  Maternal Substance Abuse:  No Significant Maternal Medications:  None Significant Maternal Lab Results: Lab values include: Group B Strep negative  Results for orders placed or performed during the hospital encounter of 08/25/17 (from the past 24 hour(s))  Urinalysis, Routine w reflex microscopic   Collection Time: 08/25/17 12:48 PM  Result Value Ref Range   Color, Urine YELLOW YELLOW   APPearance CLEAR CLEAR   Specific Gravity, Urine 1.014 1.005 - 1.030   pH 6.0 5.0 - 8.0   Glucose, UA NEGATIVE NEGATIVE mg/dL   Hgb  urine dipstick NEGATIVE NEGATIVE   Bilirubin Urine NEGATIVE NEGATIVE   Ketones, ur NEGATIVE NEGATIVE mg/dL   Protein, ur NEGATIVE NEGATIVE mg/dL   Nitrite NEGATIVE NEGATIVE   Leukocytes, UA NEGATIVE NEGATIVE  Protein / creatinine ratio, urine   Collection Time: 08/25/17 12:48 PM  Result Value Ref Range   Creatinine, Urine 132.00 mg/dL   Total Protein, Urine 10 mg/dL   Protein Creatinine Ratio 0.08 0.00 - 0.15 mg/mg[Cre]  CBC   Collection Time: 08/25/17  1:31 PM  Result Value Ref Range   WBC 13.2 (H) 4.0 - 10.5 K/uL   RBC 4.51 3.87 - 5.11 MIL/uL   Hemoglobin 11.5 (L) 12.0 - 15.0 g/dL   HCT 40.935.8 (L) 81.136.0 - 91.446.0 %   MCV 79.4 78.0 - 100.0 fL   MCH 25.5 (L) 26.0 - 34.0 pg   MCHC 32.1 30.0 - 36.0 g/dL   RDW 78.216.6 (H) 95.611.5 - 21.315.5 %   Platelets 268 150 - 400 K/uL  Comprehensive metabolic panel   Collection Time: 08/25/17  1:31 PM  Result Value Ref Range   Sodium 138 135 - 145 mmol/L   Potassium 3.7 3.5 - 5.1 mmol/L   Chloride 108 101 - 111 mmol/L   CO2 20 (L) 22 - 32 mmol/L   Glucose, Bld 79 65 - 99 mg/dL   BUN 6 6 - 20 mg/dL   Creatinine, Ser 0.860.49 0.44 - 1.00 mg/dL   Calcium 8.7 (L) 8.9 - 10.3 mg/dL   Total Protein 6.2 (L) 6.5 - 8.1 g/dL   Albumin 2.5 (L) 3.5 - 5.0 g/dL   AST 26 15 - 41 U/L   ALT 25 14 - 54 U/L   Alkaline Phosphatase 100 38 - 126 U/L   Total Bilirubin 0.5 0.3 - 1.2 mg/dL   GFR calc non Af Amer >60 >60 mL/min   GFR calc Af Amer >60 >60 mL/min   Anion gap 10 5 - 15  Type and screen [facility]   Collection Time: 08/25/17  1:31 PM  Result Value Ref Range   ABO/RH(D) B POS    Antibody Screen NEG    Sample Expiration 08/28/2017     Patient Active Problem List   Diagnosis Date Noted  . Gestational hypertension without significant proteinuria, affecting childbirth 08/25/2017    Assessment/Plan:  Kaitlyn Bailey is a 21 y.o. G2P0010 at 9083w5d here for IOL for gHTN, pre-e labs normal  #Labor: Foley bulb placed and will do oral  cytotec #Pain: Plans epidural #FWB: Cat 1 #ID:  GBS neg #MOF: Breast #MOC: IUD #Circ: outpatient  Rolm Bookbinderaroline M Shalaya Swailes, CNM  08/25/2017, 5:34 PM

## 2017-08-25 NOTE — Anesthesia Preprocedure Evaluation (Deleted)
Anesthesia Evaluation  Patient identified by MRN, date of birth, ID band Patient awake    Reviewed: Allergy & Precautions, NPO status , Patient's Chart, lab work & pertinent test results, reviewed documented beta blocker date and time   Airway Mallampati: III  TM Distance: >3 FB Neck ROM: Full    Dental no notable dental hx. (+) Teeth Intact   Pulmonary neg pulmonary ROS,    Pulmonary exam normal breath sounds clear to auscultation       Cardiovascular hypertension, Pt. on medications +CHF  Normal cardiovascular exam Rhythm:Regular Rate:Normal  Hx/o CHF after last pregnancy Echo- 05/2017 LVEF 65-70% no RWMA   Neuro/Psych PSYCHIATRIC DISORDERS Depression negative neurological ROS     GI/Hepatic Neg liver ROS, GERD  Medicated and Controlled,  Endo/Other  Morbid obesitySuper MO  Renal/GU negative Renal ROS  negative genitourinary   Musculoskeletal negative musculoskeletal ROS (+)   Abdominal (+) + obese,   Peds  Hematology  (+) anemia ,   Anesthesia Other Findings   Reproductive/Obstetrics (+) Pregnancy                             Anesthesia Physical Anesthesia Plan  ASA: III  Anesthesia Plan: Epidural   Post-op Pain Management:    Induction:   PONV Risk Score and Plan:   Airway Management Planned: Natural Airway  Additional Equipment:   Intra-op Plan:   Post-operative Plan:   Informed Consent: I have reviewed the patients History and Physical, chart, labs and discussed the procedure including the risks, benefits and alternatives for the proposed anesthesia with the patient or authorized representative who has indicated his/her understanding and acceptance.     Plan Discussed with: Anesthesiologist  Anesthesia Plan Comments:         Anesthesia Quick Evaluation

## 2017-08-25 NOTE — Anesthesia Pain Management Evaluation Note (Deleted)
  CRNA Pain Management Visit Note  Patient: Kaitlyn Bailey, 21 y.o., female  "Hello I am a member of the anesthesia team at [facility]. We have an anesthesia team available at all times to provide care throughout the hospital, including epidural management and anesthesia for C-section. I don't know your plan for the delivery whether it a natural birth, water birth, IV sedation, nitrous supplementation, doula or epidural, but we want to meet your pain goals."   1.Was your pain managed to your expectations on prior hospitalizations?   Yes for 2 previous epidurals and no fir 1 epidural  2.What is your expectation for pain management during this hospitalization?     Epidural  3.How can we help you reach that goal? Epidural when desired  Record the patient's initial score and the patient's pain goal.   Pain: 0  Pain Goal: 5 [facility] wants you to be able to say your pain was always managed very well.  Kaitlyn Bailey 08/25/2017

## 2017-08-26 ENCOUNTER — Inpatient Hospital Stay (HOSPITAL_COMMUNITY): Payer: 59 | Admitting: Anesthesiology

## 2017-08-26 ENCOUNTER — Other Ambulatory Visit: Payer: 59

## 2017-08-26 LAB — CBC
HCT: 35.7 % — ABNORMAL LOW (ref 36.0–46.0)
Hemoglobin: 11.5 g/dL — ABNORMAL LOW (ref 12.0–15.0)
MCH: 25.6 pg — ABNORMAL LOW (ref 26.0–34.0)
MCHC: 32.2 g/dL (ref 30.0–36.0)
MCV: 79.3 fL (ref 78.0–100.0)
Platelets: 266 10*3/uL (ref 150–400)
RBC: 4.5 MIL/uL (ref 3.87–5.11)
RDW: 16.9 % — AB (ref 11.5–15.5)
WBC: 13.4 10*3/uL — ABNORMAL HIGH (ref 4.0–10.5)

## 2017-08-26 LAB — RPR: RPR: NONREACTIVE

## 2017-08-26 MED ORDER — LIDOCAINE HCL (PF) 1 % IJ SOLN
INTRAMUSCULAR | Status: DC | PRN
  Administered 2017-08-26 (×2): 5 mL

## 2017-08-26 NOTE — Progress Notes (Signed)
Vitals:   08/26/17 0430 08/26/17 0509  BP:  (!) 112/58  Pulse:  79  Resp: 16 18  Temp:    SpO2:     Sleeping soundly. No change in cx Pitocin at 14 mu/min. Ctx irregular, m ild. FHR 120's.  Will increase pitocin until active labor.

## 2017-08-26 NOTE — Progress Notes (Signed)
Kaitlyn Bailey is a 21 y.o. G2P0010 at 6932w6d.  Subjective: Patient is very satisfied with her epidural and is resting comfortably.   We discussed checking her dilation but she said her nurse had just checked her  Objective: BP 140/84   Pulse (!) 113   Temp 98.1 F (36.7 C) (Oral)   Resp 18   Ht 5\' 4"  (1.626 m)   Wt 136.1 kg (300 lb)   SpO2 98%   BMI 51.49 kg/m    FHT:  FHR: 135 bpm, variability: appropriate,  accelerations:  15x15,  decelerations:  none UC:   Q 2-323minutes, 70-90 Dilation: 5 Effacement (%): 80 Station: 0 Presentation: Vertex Exam by:: [employer]  Labs: Results for orders placed or performed during the hospital encounter of 08/25/17 (from the past 24 hour(s))  CBC     Status: Abnormal   Collection Time: 08/26/17  5:01 PM  Result Value Ref Range   WBC 13.4 (H) 4.0 - 10.5 K/uL   RBC 4.50 3.87 - 5.11 MIL/uL   Hemoglobin 11.5 (L) 12.0 - 15.0 g/dL   HCT 09.835.7 (L) 11.936.0 - 14.746.0 %   MCV 79.3 78.0 - 100.0 fL   MCH 25.6 (L) 26.0 - 34.0 pg   MCHC 32.2 30.0 - 36.0 g/dL   RDW 82.916.9 (H) 56.211.5 - 13.015.5 %   Platelets 266 150 - 400 K/uL    Assessment / Plan: wil not recheck cervix right now as nurse just did 3132w6d week IUP Labor: pit Fetal Wellbeing:  Category 1 Pain Control:  epidural Anticipated MOD:  svd  Marthenia RollingBland, Kaitlyn Nilsen, DO 08/26/2017 7:07 PM

## 2017-08-26 NOTE — Anesthesia Procedure Notes (Signed)
Epidural Patient location during procedure: OB  Staffing Anesthesiologist: Mozell Haber, MD Performed: anesthesiologist   Preanesthetic Checklist Completed: patient identified, site marked, surgical consent, pre-op evaluation, timeout performed, IV checked, risks and benefits discussed and monitors and equipment checked  Epidural Patient position: sitting Prep: DuraPrep Patient monitoring: heart rate, continuous pulse ox and blood pressure Approach: right paramedian Location: L3-L4 Injection technique: LOR saline  Needle:  Needle type: Tuohy  Needle gauge: 17 G Needle length: 9 cm and 9 Needle insertion depth: 7 cm Catheter type: closed end flexible Catheter size: 20 Guage Catheter at skin depth: 12 cm Test dose: negative  Assessment Events: blood not aspirated, injection not painful, no injection resistance, negative IV test and no paresthesia  Additional Notes Patient identified. Risks/Benefits/Options discussed with patient including but not limited to bleeding, infection, nerve damage, paralysis, failed block, incomplete pain control, headache, blood pressure changes, nausea, vomiting, reactions to medication both or allergic, itching and postpartum back pain. Confirmed with bedside nurse the patient's most recent platelet count. Confirmed with patient that they are not currently taking any anticoagulation, have any bleeding history or any family history of bleeding disorders. Patient expressed understanding and wished to proceed. All questions were answered. Sterile technique was used throughout the entire procedure. Please see nursing notes for vital signs. Test dose was given through epidural needle and negative prior to continuing to dose epidural or start infusion. Warning signs of high block given to the patient including shortness of breath, tingling/numbness in hands, complete motor block, or any concerning symptoms with instructions to call for help. Patient was given  instructions on fall risk and not to get out of bed. All questions and concerns addressed with instructions to call with any issues.     

## 2017-08-26 NOTE — Anesthesia Preprocedure Evaluation (Signed)
Anesthesia Evaluation  Patient identified by MRN, date of birth, ID band Patient awake    Reviewed: Allergy & Precautions, H&P , NPO status , Patient's Chart, lab work & pertinent test results  History of Anesthesia Complications Negative for: history of anesthetic complications  Airway Mallampati: II  TM Distance: >3 FB Neck ROM: full    Dental no notable dental hx. (+) Teeth Intact   Pulmonary neg pulmonary ROS,    Pulmonary exam normal breath sounds clear to auscultation       Cardiovascular hypertension, Normal cardiovascular exam Rhythm:regular Rate:Normal     Neuro/Psych negative neurological ROS  negative psych ROS   GI/Hepatic negative GI ROS, Neg liver ROS,   Endo/Other  Morbid obesity  Renal/GU negative Renal ROS  negative genitourinary   Musculoskeletal   Abdominal   Peds  Hematology negative hematology ROS (+)   Anesthesia Other Findings   Reproductive/Obstetrics (+) Pregnancy                             Anesthesia Physical Anesthesia Plan  ASA: III  Anesthesia Plan: Epidural   Post-op Pain Management:    Induction:   PONV Risk Score and Plan:   Airway Management Planned:   Additional Equipment:   Intra-op Plan:   Post-operative Plan:   Informed Consent: I have reviewed the patients History and Physical, chart, labs and discussed the procedure including the risks, benefits and alternatives for the proposed anesthesia with the patient or authorized representative who has indicated his/her understanding and acceptance.       Plan Discussed with:   Anesthesia Plan Comments:         Anesthesia Quick Evaluation  

## 2017-08-26 NOTE — Progress Notes (Signed)
L&D Note  08/26/2017 - 2:53 PM  21 y.o. G2P0010 8873w6d. Pregnancy complicated by BMI 50s  Patient Active Problem List   Diagnosis Date Noted  . Gestational hypertension without significant proteinuria, affecting childbirth 08/25/2017    Ms. Kaitlyn Bailey is admitted for IOL for gHTN at 12/20 in late afternoon    Subjective:  Not feeling any UCs  Objective:   Vitals:   08/26/17 1004 08/26/17 1056 08/26/17 1208 08/26/17 1245  BP: 130/79 (!) 141/78 117/79 (!) 135/96  Pulse: (!) 101 98 (!) 101 (!) 103  Resp: 20 18 18 18   Temp:      TempSrc:      SpO2:      Weight:      Height:        Current Vital Signs 24h Vital Sign Ranges  T 98.5 F (36.9 C) Temp  Avg: 98.4 F (36.9 C)  Min: 98.1 F (36.7 C)  Max: 99 F (37.2 C)  BP (!) 135/96 BP  Min: 106/62  Max: 151/91  HR (!) 103 Pulse  Avg: 96.7  Min: 79  Max: 113  RR 18 Resp  Avg: 17.4  Min: 16  Max: 20  SaO2 98 % Not Delivered No Data Recorded       24 Hour I/O Current Shift I/O  Time Ins Outs 12/20 0701 - 12/21 0700 In: 354.2 [I.V.:354.2] Out: -  No intake/output data recorded.   FHR: 135 baseline, +accels, no decels, mod varability Toco: ?q3-4327m Gen: NAD SVE: 4/70/-1-->AROM clear fluid 5/80/0 and patient started to feel UCs now.   Labs:  Recent Labs  Lab 08/25/17 1331  WBC 13.2*  HGB 11.5*  HCT 35.8*  PLT 268   Recent Labs  Lab 08/25/17 1331  NA 138  K 3.7  CL 108  CO2 20*  BUN 6  CREATININE 0.49  CALCIUM 8.7*  PROT 6.2*  BILITOT 0.5  ALKPHOS 100  ALT 25  AST 26  GLUCOSE 79    Medications Current Facility-Administered Medications  Medication Dose Route Frequency Provider Last Rate Last Dose  . acetaminophen (TYLENOL) tablet 650 mg  650 mg Oral Q4H PRN Judeth HornLawrence, Erin, NP      . diphenhydrAMINE (BENADRYL) injection 12.5 mg  12.5 mg Intravenous Q15 min PRN Mal AmabileFoster, Michael, MD      . fentaNYL 2.5 mcg/ml w/bupivacaine 0.1% in NS 100ml epidural infusion (WH-ANES)  14 mL/hr Epidural Continuous  PRN Mal AmabileFoster, Michael, MD      . lactated ringers infusion 500 mL  500 mL Intravenous Once Mal AmabileFoster, Michael, MD      . lactated ringers infusion 500-1,000 mL  500-1,000 mL Intravenous PRN Judeth HornLawrence, Erin, NP      . lactated ringers infusion   Intravenous Continuous Judeth HornLawrence, Erin, NP 125 mL/hr at 08/25/17 1645    . lidocaine (PF) (XYLOCAINE) 1 % injection 30 mL  30 mL Subcutaneous PRN Judeth HornLawrence, Erin, NP      . misoprostol (CYTOTEC) tablet 50 mcg  50 mcg Oral Q4H PRN Rolm Bookbindereill, Caroline M, CNM   50 mcg at 08/25/17 1758  . ondansetron (ZOFRAN) injection 4 mg  4 mg Intravenous Q6H PRN Judeth HornLawrence, Erin, NP      . oxyCODONE-acetaminophen (PERCOCET/ROXICET) 5-325 MG per tablet 1 tablet  1 tablet Oral Q4H PRN Judeth HornLawrence, Erin, NP      . oxyCODONE-acetaminophen (PERCOCET/ROXICET) 5-325 MG per tablet 2 tablet  2 tablet Oral Q4H PRN Judeth HornLawrence, Erin, NP      . oxytocin (PITOCIN) IV BOLUS  FROM BAG  500 mL Intravenous Once Judeth HornLawrence, Erin, NP      . oxytocin (PITOCIN) IV infusion 40 units in LR 1000 mL - Premix  2.5 Units/hr Intravenous Continuous Judeth HornLawrence, Erin, NP      . oxytocin (PITOCIN) IV infusion 40 units in LR 1000 mL - Premix  1-40 milli-units/min Intravenous Titrated Jacklyn Shellresenzo-Dishmon, Frances, CNM 15 mL/hr at 08/26/17 1410 10 milli-units/min at 08/26/17 1410  . PHENYLephrine 40 mcg/ml in normal saline Adult IV Push Syringe  80 mcg Intravenous PRN Mal AmabileFoster, Michael, MD      . sodium citrate-citric acid (ORACIT) solution 30 mL  30 mL Oral Q2H PRN Judeth HornLawrence, Erin, NP      . sodium phosphate (FLEET) 7-19 GM/118ML enema 1 enema  1 enema Rectal PRN Judeth HornLawrence, Erin, NP      . terbutaline (BRETHINE) injection 0.25 mg  0.25 mg Subcutaneous Once PRN Rolm BookbinderNeill, Caroline M, CNM      . terbutaline (BRETHINE) injection 0.25 mg  0.25 mg Subcutaneous Once PRN Jacklyn Shellresenzo-Dishmon, Frances, CNM        Assessment & Plan:  Doing well *IUP: category I tracing with accels *gHTN: asymptomatic, neg admit labs. Continue to follow *IOL: continue  with pitocin per protocol. *BMI 50s: internals placed *GBS: neg *Analgesia: IV PRNs. May have epidural PRN.   Cornelia Copaharlie Hadar Elgersma, Jr. MD Attending Center for [facility](Faculty Practice)

## 2017-08-27 ENCOUNTER — Encounter (HOSPITAL_COMMUNITY): Payer: Self-pay | Admitting: Obstetrics and Gynecology

## 2017-08-27 DIAGNOSIS — O134 Gestational [pregnancy-induced] hypertension without significant proteinuria, complicating childbirth: Secondary | ICD-10-CM

## 2017-08-27 DIAGNOSIS — Z3A37 37 weeks gestation of pregnancy: Secondary | ICD-10-CM

## 2017-08-27 LAB — CBC
HCT: 35.7 % — ABNORMAL LOW (ref 36.0–46.0)
Hemoglobin: 11.6 g/dL — ABNORMAL LOW (ref 12.0–15.0)
MCH: 25.6 pg — ABNORMAL LOW (ref 26.0–34.0)
MCHC: 32.5 g/dL (ref 30.0–36.0)
MCV: 78.8 fL (ref 78.0–100.0)
PLATELETS: 235 10*3/uL (ref 150–400)
RBC: 4.53 MIL/uL (ref 3.87–5.11)
RDW: 16.6 % — AB (ref 11.5–15.5)
WBC: 23.4 10*3/uL — AB (ref 4.0–10.5)

## 2017-08-27 MED ORDER — BENZOCAINE-MENTHOL 20-0.5 % EX AERO: 1.0000 "application " | INHALATION_SPRAY | CUTANEOUS | Status: DC | PRN

## 2017-08-27 MED ORDER — MISOPROSTOL 200 MCG PO TABS
ORAL_TABLET | ORAL | Status: AC
  Filled 2017-08-27: qty 4

## 2017-08-27 MED ORDER — SIMETHICONE 80 MG PO CHEW: 80.0000 mg | CHEWABLE_TABLET | ORAL | Status: DC | PRN

## 2017-08-27 MED ORDER — IBUPROFEN 600 MG PO TABS
600.0000 mg | ORAL_TABLET | Freq: Four times a day (QID) | ORAL | Status: DC
  Administered 2017-08-27 – 2017-08-29 (×11): 600 mg via ORAL
  Filled 2017-08-27 (×12): qty 1

## 2017-08-27 MED ORDER — MISOPROSTOL 200 MCG PO TABS
800.0000 ug | ORAL_TABLET | Freq: Once | ORAL | Status: AC
  Administered 2017-08-27: 800 ug via BUCCAL

## 2017-08-27 MED ORDER — ZOLPIDEM TARTRATE 5 MG PO TABS
5.0000 mg | ORAL_TABLET | Freq: Every evening | ORAL | Status: DC | PRN
Start: 2017-08-27 — End: 2017-08-29

## 2017-08-27 MED ORDER — DIBUCAINE 1 % RE OINT: 1.0000 "application " | TOPICAL_OINTMENT | RECTAL | Status: DC | PRN

## 2017-08-27 MED ORDER — TETANUS-DIPHTH-ACELL PERTUSSIS 5-2.5-18.5 LF-MCG/0.5 IM SUSP
0.5000 mL | Freq: Once | INTRAMUSCULAR | Status: AC
  Administered 2017-08-28: 0.5 mL via INTRAMUSCULAR
  Filled 2017-08-27: qty 0.5

## 2017-08-27 MED ORDER — SENNOSIDES-DOCUSATE SODIUM 8.6-50 MG PO TABS
2.0000 | ORAL_TABLET | ORAL | Status: DC
  Administered 2017-08-27 – 2017-08-29 (×2): 2 via ORAL
  Filled 2017-08-27 (×2): qty 2

## 2017-08-27 MED ORDER — ONDANSETRON HCL 4 MG PO TABS
4.0000 mg | ORAL_TABLET | ORAL | Status: DC | PRN
Start: 2017-08-27 — End: 2017-08-29

## 2017-08-27 MED ORDER — COCONUT OIL OIL
1.0000 "application " | TOPICAL_OIL | Status: DC | PRN
  Filled 2017-08-27: qty 120

## 2017-08-27 MED ORDER — ACETAMINOPHEN 325 MG PO TABS
650.0000 mg | ORAL_TABLET | ORAL | Status: DC | PRN
Start: 2017-08-27 — End: 2017-08-29
  Administered 2017-08-27 – 2017-08-29 (×4): 650 mg via ORAL
  Filled 2017-08-27 (×4): qty 2

## 2017-08-27 MED ORDER — ONDANSETRON HCL 4 MG/2ML IJ SOLN: 4.0000 mg | INTRAMUSCULAR | Status: DC | PRN

## 2017-08-27 MED ORDER — SODIUM CHLORIDE 0.9 % IV SOLN
2.0000 g | Freq: Three times a day (TID) | INTRAVENOUS | Status: DC
  Administered 2017-08-27: 2 g via INTRAVENOUS
  Filled 2017-08-27 (×2): qty 2000

## 2017-08-27 MED ORDER — METHYLERGONOVINE MALEATE 0.2 MG/ML IJ SOLN
0.2000 mg | Freq: Once | INTRAMUSCULAR | Status: AC
  Administered 2017-08-27: 0.2 mg via INTRAMUSCULAR

## 2017-08-27 MED ORDER — DEXTROSE 5 % IV SOLN
5.0000 mg/kg | INTRAVENOUS | Status: DC
  Administered 2017-08-27: 440 mg via INTRAVENOUS
  Filled 2017-08-27: qty 11

## 2017-08-27 MED ORDER — DIPHENHYDRAMINE HCL 25 MG PO CAPS: 25.0000 mg | ORAL_CAPSULE | Freq: Four times a day (QID) | ORAL | Status: DC | PRN

## 2017-08-27 MED ORDER — PRENATAL MULTIVITAMIN CH
1.0000 | ORAL_TABLET | Freq: Every day | ORAL | Status: DC
  Administered 2017-08-28 – 2017-08-29 (×2): 1 via ORAL
  Filled 2017-08-27 (×2): qty 1

## 2017-08-27 MED ORDER — WITCH HAZEL-GLYCERIN EX PADS: 1.0000 "application " | MEDICATED_PAD | CUTANEOUS | Status: DC | PRN

## 2017-08-27 NOTE — Lactation Note (Signed)
This note was copied from a baby's chart. Lactation Consultation Note; Initial visit with this mom.Baby now 4 hours old. She reports he nursed well after delivery with no pain. Reviewed feeding cues and encouraged to feed whenever she sees them. She reports L & D RN showed her hand expression. Reviewed normal behavior the first 24 hours. Offered assist with latch but mom refused stating she will feed him after his bath in 45 min. Baby asleep in bassinet at present. BF brochure given, reviewed our phone number, OP appointments and BFSG as resources for support after DC. Asking about pumping- going back to work at 6-8 [employer]. She has manual pump at home. Has WIC- states she plans to breast feed only at this time. [facility] No further questions at present. To call for assist prn  Patient Name: Kaitlyn Bailey OZHYQ'MToday's Date: 08/27/2017 Reason for consult: Initial assessment   Maternal Data Formula Feeding for Exclusion: No Has patient been taught Hand Expression?: Yes Does the patient have breastfeeding experience prior to this delivery?: No  Feeding  LATCH Score Interventions    Lactation Tools Discussed/Used WIC Program: Yes   Consult Status Consult Status: Follow-up Date: 08/28/17 Follow-up type: In-patient    Pamelia HoitWeeks, Tacuma Graffam D 08/27/2017, 11:12 AM

## 2017-08-27 NOTE — Lactation Note (Signed)
This note was copied from a baby's chart. Lactation Consultation Note  Patient Name: Kaitlyn Bailey ZOXWR'UToday's Date: 08/27/2017 Reason for consult: Follow-up assessment;Difficult latch Mom frustrated because she cant get baby to latch. Assisted with positioning baby in football hold.  With good breast compression baby latched well.  Baby sleepy at breast and needs stimulation and breast massage to stay active.  Encouraged mom to call out for assist prn.  Maternal Data    Feeding Feeding Type: Breast Fed  LATCH Score Latch: Grasps breast easily, tongue down, lips flanged, rhythmical sucking.  Audible Swallowing: A few with stimulation  Type of Nipple: Everted at rest and after stimulation  Comfort (Breast/Nipple): Soft / non-tender  Hold (Positioning): Assistance needed to correctly position infant at breast and maintain latch.  LATCH Score: 8  Interventions Interventions: Breast feeding basics reviewed;Assisted with latch;Breast compression;Skin to skin;Adjust position;Breast massage;Support pillows;Hand express  Lactation Tools Discussed/Used     Consult Status Consult Status: Follow-up Date: 08/28/17    Huston FoleyMOULDEN, Brice Kossman S 08/27/2017, 10:51 PM

## 2017-08-27 NOTE — Anesthesia Postprocedure Evaluation (Signed)
Anesthesia Post Note  Patient: Kaitlyn Bailey  Procedure(s) Performed: AN AD HOC LABOR EPIDURAL     Patient location during evaluation: Mother Baby Anesthesia Type: Epidural Level of consciousness: awake and alert and oriented Pain management: satisfactory to patient Vital Signs Assessment: post-procedure vital signs reviewed and stable Respiratory status: spontaneous breathing and nonlabored ventilation Cardiovascular status: stable Postop Assessment: no headache, no backache, no signs of nausea or vomiting, adequate PO intake and patient able to bend at knees (patient up walking) Anesthetic complications: no    Last Vitals:  Vitals:   08/27/17 1041 08/27/17 1330  BP:  (!) 135/93  Pulse:  97  Resp:  18  Temp: 37.7 C 36.6 C  SpO2:      Last Pain:  Vitals:   08/27/17 1330  TempSrc: Oral  PainSc:    Pain Goal:                 Madison HickmanGREGORY,Mahad Newstrom

## 2017-08-28 NOTE — Lactation Note (Signed)
This note was copied from a baby's chart. Lactation Consultation Note  Patient Name: Kaitlyn Bailey HQION'GToday's Date: 08/28/2017 Reason for consult: Follow-up assessment;Primapara;1st time breastfeeding;Early term 37-38.6wks;Infant weight loss(4% weight loss )  Baby is 35 hours old  LC reviewed and updated doc flow sheets.  Baby recently breast fed for 60 mins. While LC in the baby was fussy for short interval  And LC burped the baby/ baby burped and went back to sleep.  Mom denies soreness, sore nipple and engorgement prevention and tx reviewed.  LC instructed mom on the use hand pump, and increased flange to #27 if needed.  Mother informed of post-discharge support and given phone number to the lactation department, including services for phone call assistance; out-patient appointments; and breastfeeding support group. List of other breastfeeding resources in the community given in the handout. Encouraged mother to call for problems or concerns related to breastfeeding.   Maternal Data Has patient been taught Hand Expression?: (per mom feels comfortable )  Feeding Feeding Type: (per mom  baby has fed recently ) Length of feed: 60 min  LATCH Score                   Interventions Interventions: Breast feeding basics reviewed  Lactation Tools Discussed/Used Tools: Pump;Flanges Flange Size: 27 Breast pump type: Manual WIC Program: Yes Pump Review: Setup, frequency, and cleaning Initiated by:: MAI  Date initiated:: 08/28/17   Consult Status Consult Status: PRN Date: 08/29/17    Matilde SprangMargaret Ann Mehmet Scally 08/28/2017, 6:00 PM

## 2017-08-28 NOTE — Progress Notes (Signed)
Post Partum Day 1 Subjective: no complaints, up ad lib, voiding and tolerating PO  Objective: Blood pressure 110/61, pulse 72, temperature 97.6 F (36.4 C), temperature source Axillary, resp. rate 18, height 5\' 4"  (1.626 m), weight 291 lb 9.6 oz (132.3 kg), SpO2 98 %, unknown if currently breastfeeding.  Physical Exam:  General: alert, cooperative, appears stated age and no distress Lochia: appropriate Uterine Fundus: firm Incision: healing well DVT Evaluation: No evidence of DVT seen on physical exam.  Recent Labs    08/26/17 1701 08/27/17 0812  HGB 11.5* 11.6*  HCT 35.7* 35.7*    Assessment/Plan: Plan for discharge tomorrow   LOS: 3 days   Kaitlyn Bailey 08/28/2017, 9:35 AM

## 2017-08-29 MED ORDER — IBUPROFEN 600 MG PO TABS: 600.0000 mg | ORAL_TABLET | Freq: Four times a day (QID) | ORAL | 0 refills | Status: AC

## 2017-08-29 NOTE — Discharge Summary (Signed)
OB Discharge Summary     Patient Name: Kaitlyn Bailey DOB: 09/26/1995 MRN: [id number]  Date of admission: 08/25/2017 Delivering MD: Rolm BookbinderMOSS, AMBER   Date of discharge: 08/29/2017  Admitting diagnosis: HIGH BP,BLURRED VISION Intrauterine pregnancy: 10469w0d     Secondary diagnosis:  Active Problems:   Gestational hypertension without significant proteinuria, affecting childbirth  Additional problems: n/a     Discharge diagnosis: Term Pregnancy Delivered                                                                                                Post partum procedures:n/a  Augmentation: AROM, Pitocin, Cytotec and Foley Balloon  Complications: Intrauterine Inflammation or infection (Chorioamniotis)  Hospital course:  Induction of Labor With Vaginal Delivery   21 y.o. yo G2P1011 at 4469w0d was admitted to the hospital 08/25/2017 for induction of labor.  Indication for induction: Gestational hypertension.  Patient had an uncomplicated labor course as follows: Membrane Rupture Time/Date: 2:57 PM ,08/26/2017   Intrapartum Procedures: Episiotomy: None [1]                                         Lacerations:  1st degree [2]  Patient had delivery of a Viable infant.  Information for the patient's newborn:  Kaitlyn Bailey, Kaitlyn Bailey [[id number]][[id number]]  Delivery Method: Vaginal, Spontaneous(Filed from Delivery Summary)   08/27/2017  Details of delivery can be found in separate delivery note.  Patient had a routine postpartum course. Patient is discharged home 08/29/17.  Physical exam  Vitals:   08/28/17 0610 08/28/17 0826 08/28/17 1753 08/29/17 0520  BP: 110/61  (!) 112/52 126/78  Pulse: 72  89 77  Resp: 18  18 18   Temp: 97.6 F (36.4 C)  98.2 F (36.8 C) 98.1 F (36.7 C)  TempSrc: Axillary  Oral   SpO2:      Weight:  291 lb 9.6 oz (132.3 kg)  291 lb 6 oz (132.2 kg)  Height:       General: alert, cooperative and no distress Lochia: appropriate Uterine Fundus: firm Incision:  N/A DVT Evaluation: No evidence of DVT seen on physical exam. Labs: Lab Results  Component Value Date   WBC 23.4 (H) 08/27/2017   HGB 11.6 (L) 08/27/2017   HCT 35.7 (L) 08/27/2017   MCV 78.8 08/27/2017   PLT 235 08/27/2017   CMP Latest Ref Rng & Units 08/25/2017  Glucose 65 - 99 mg/dL 79  BUN 6 - 20 mg/dL 6  Creatinine 7.820.44 - 9.561.00 mg/dL 2.130.49  Sodium 086135 - 578145 mmol/L 138  Potassium 3.5 - 5.1 mmol/L 3.7  Chloride 101 - 111 mmol/L 108  CO2 22 - 32 mmol/L 20(L)  Calcium 8.9 - 10.3 mg/dL 4.6(N8.7(L)  Total Protein 6.5 - 8.1 g/dL 6.2(L)  Total Bilirubin 0.3 - 1.2 mg/dL 0.5  Alkaline Phos 38 - 126 U/L 100  AST 15 - 41 U/L 26  ALT 14 - 54 U/L 25    Discharge instruction: per After Visit Summary and "Baby and Me  Booklet".  After visit meds:  Allergies as of 08/29/2017   No Known Allergies     Medication List    STOP taking these medications   acetaminophen-codeine 300-30 MG tablet Commonly known as:  TYLENOL #3     TAKE these medications   acetaminophen 500 MG tablet Commonly known as:  TYLENOL Take 500 mg by mouth every 6 (six) hours as needed for moderate pain.   calcium carbonate 500 MG chewable tablet Commonly known as:  TUMS - dosed in mg elemental calcium Chew 1 tablet by mouth 2 (two) times daily as needed for indigestion or heartburn.   ibuprofen 600 MG tablet Commonly known as:  ADVIL,MOTRIN Take 1 tablet (600 mg total) by mouth every 6 (six) hours.   PRENATAL COMPLETE 14-0.4 MG Tabs Take 1 tablet by mouth daily after breakfast.       Diet: routine diet  Activity: Advance as tolerated. Pelvic rest for 6 weeks.   Outpatient follow up:4 weeks Follow up Appt:No future appointments. Follow up Visit:No Follow-up on file.  Postpartum contraception: IUD Mirena  Newborn Data: Live born female  Birth Weight: 7 lb 1.6 oz (3220 g) APGAR: 8, 9  Newborn Delivery   Birth date/time:  08/27/2017 06:16:00 Delivery type:  Vaginal, Spontaneous     Baby Feeding:  Breast Disposition:home with mother   08/29/2017 Rolm Bookbinderaroline M Saw Mendenhall, CNM

## 2017-08-29 NOTE — Discharge Instructions (Signed)
Postpartum Care After Vaginal Delivery °The period of time right after you deliver your newborn is called the postpartum period. °What kind of medical care will I receive? °· You may continue to receive fluids and medicines through an IV tube inserted into one of your veins. °· If an incision was made near your vagina (episiotomy) or if you had some vaginal tearing during delivery, cold compresses may be placed on your episiotomy or your tear. This helps to reduce pain and swelling. °· You may be given a squirt bottle to use when you go to the bathroom. You may use this until you are comfortable wiping as usual. To use the squirt bottle, follow these steps: °? Before you urinate, fill the squirt bottle with warm water. Do not use hot water. °? After you urinate, while you are sitting on the toilet, use the squirt bottle to rinse the area around your urethra and vaginal opening. This rinses away any urine and blood. °? You may do this instead of wiping. As you start healing, you may use the squirt bottle before wiping yourself. Make sure to wipe gently. °? Fill the squirt bottle with clean water every time you use the bathroom. °· You will be given sanitary pads to wear. °How can I expect to feel? °· You may not feel the need to urinate for several hours after delivery. °· You will have some soreness and pain in your abdomen and vagina. °· If you are breastfeeding, you may have uterine contractions every time you breastfeed for up to several weeks postpartum. Uterine contractions help your uterus return to its normal size. °· It is normal to have vaginal bleeding (lochia) after delivery. The amount and appearance of lochia is often similar to a menstrual period in the first week after delivery. It will gradually decrease over the next few weeks to a dry, yellow-brown discharge. For most women, lochia stops completely by 6-8 weeks after delivery. Vaginal bleeding can vary from woman to woman. °· Within the first few  days after delivery, you may have breast engorgement. This is when your breasts feel heavy, full, and uncomfortable. Your breasts may also throb and feel hard, tightly stretched, warm, and tender. After this occurs, you may have milk leaking from your breasts. Your health care provider can help you relieve discomfort due to breast engorgement. Breast engorgement should go away within a few days. °· You may feel more sad or worried than normal due to hormonal changes after delivery. These feelings should not last more than a few days. If these feelings do not go away after several days, speak with your health care provider. °How should I care for myself? °· Tell your health care provider if you have pain or discomfort. °· Drink enough water to keep your urine clear or pale yellow. °· Wash your hands thoroughly with soap and water for at least 20 seconds after changing your sanitary pads, after using the toilet, and before holding or feeding your baby. °· If you are not breastfeeding, avoid touching your breasts a lot. Doing this can make your breasts produce more milk. °· If you become weak or lightheaded, or you feel like you might faint, ask for help before: °? Getting out of bed. °? Showering. °· Change your sanitary pads frequently. Watch for any changes in your flow, such as a sudden increase in volume, a change in color, the passing of large blood clots. If you pass a blood clot from your vagina, save it   to show to your health care provider. Do not flush blood clots down the toilet without having your health care provider look at them. °· Make sure that all your vaccinations are up to date. This can help protect you and your baby from getting certain diseases. You may need to have immunizations done before you leave the hospital. °· If desired, talk with your health care provider about methods of family planning or birth control (contraception). °How can I start bonding with my baby? °Spending as much time as  possible with your baby is very important. During this time, you and your baby can get to know each other and develop a bond. Having your baby stay with you in your room (rooming in) can give you time to get to know your baby. Rooming in can also help you become comfortable caring for your baby. Breastfeeding can also help you bond with your baby. °How can I plan for returning home with my baby? °· Make sure that you have a car seat installed in your vehicle. °? Your car seat should be checked by a certified car seat installer to make sure that it is installed safely. °? Make sure that your baby fits into the car seat safely. °· Ask your health care provider any questions you have about caring for yourself or your baby. Make sure that you are able to contact your health care provider with any questions after leaving the hospital. °This information is not intended to replace advice given to you by your health care provider. Make sure you discuss any questions you have with your health care provider. °Document Released: 06/20/2007 Document Revised: 01/26/2016 Document Reviewed: 07/28/2015 °Elsevier Interactive Patient Education © 2018 [employer] ° °

## 2017-08-29 NOTE — Progress Notes (Signed)
Patientt crying, stating she is tired and baby wont sleep in the crib. Educated mother on cluster feeding, assisted mother with latch and encouraged mother to continue asking for help as needed. Reassured mother that she was doing a great job.

## 2017-08-29 NOTE — Progress Notes (Addendum)
CSW received consult due to score 16 on Edinburgh Depression Screen and hx of depression.    When CSW arrived, MOB was resting in bed and FOB was laying on the couch engaging in skin to skin with infant.  CSW explained CSW' role and MOB gave CSW permission to meet with MOB while FOB was present.   CSW reviewed the EDPS score and explained MOB's score.  CSW asked how MOB was currently feeling.  MOB shared that MOB was feeling overwhelmed and disappointmented that MOB was not going to be d/c on today.  MOB became tearful and expressed that MOB was concerned about the infant's jaundice and not feeling supported with also wanting to formula feed.  CSW validated and normalized MOB's thoughts and feelings and offered to share MOB's concerns with MOB's bedside nurse; MOB was appreciative.   CSW provided education regarding Baby Blues vs PMADs.  CSW encouraged MOB to evaluate her mental health throughout the postpartum period with the use of the New Mom Checklist developed by Postpartum Progress and notify a medical professional if symptoms arise. MOB presented with insight and awareness and did not present with any acute symptoms. CSW assessed for safety and MOB denied, SI, HI, and DV.  MOB was receptive to referrals for outpatient counseling.   FOB was not engaged during the session and was occupied with taking pictures of infant and Face Timing.  MOB reports a huge support team that will be able to assist MOB after d/c.  There are no barriers to d/c.  Carmeline Kowal Boyd-Gilyard, MSW, LCSW Clinical Social Work [phone]    

## 2017-08-29 NOTE — Lactation Note (Addendum)
This note was copied from a baby's chart. Lactation Consultation Note  Patient Name: Kaitlyn Bailey Reason for consult: Follow-up assessment;1st time breastfeeding;Primapara;Early term 37-38.6wks;Infant weight loss  Baby is 50 hours old  LC reviewed and updated the doc flow sheets per mom  8% weight loss, 5 wets , 4 stools,  Per mom right nipple tender, LC assessed with moms permission and noted no breakdown,  Noted areola edema. LC reviewed basics - breast massage, hand express, pre-pump to make the  Nipple and areola complex more compressible. Worked on positioning and depth and heard more swallows. Breast compressions also increased swallows and per mom comfortable.  Sore nipple and engorgement prevention and tx reviewed.  Mom already has a hand pump , increased flange to #27 if needed. And this am instructed mom on the  Use of shells for areola edema.  LC recommended the pre-pumping with hand pump with enhance letdown.  Mother informed of post-discharge support and given phone number to the lactation department, including services for phone call assistance; out-patient appointments; and breastfeeding support group. List of other breastfeeding resources in the community given in the handout. Encouraged mother to call for problems or concerns related to breastfeeding.  LC suspects baby hasn't been latching with depth consistently.  LC reviewed with mom and add why that is so important.   Maternal Data Has patient been taught Hand Expression?: Yes  Feeding Feeding Type: Breast Fed Length of feed: (swallows noted )  LATCH Score Latch: Grasps breast easily, tongue down, lips flanged, rhythmical sucking.  Audible Swallowing: Spontaneous and intermittent  Type of Nipple: Everted at rest and after stimulation  Comfort (Breast/Nipple): Soft / non-tender  Hold (Positioning): Assistance needed to correctly position infant at breast and maintain  latch.  LATCH Score: 9  Interventions Interventions: Breast feeding basics reviewed  Lactation Tools Discussed/Used Tools: Shells;Pump Flange Size: 27 Shell Type: Inverted Breast pump type: Manual   Consult Status Consult Status: Complete Date: 08/29/17 Follow-up type: In-patient    Matilde SprangMargaret Ann Lulia Schriner Bailey, 8:21 AM

## 2017-09-02 ENCOUNTER — Other Ambulatory Visit: Payer: 59

## 2017-09-02 ENCOUNTER — Encounter: Payer: 59 | Admitting: Obstetrics & Gynecology

## 2017-09-08 ENCOUNTER — Telehealth: Payer: Self-pay | Admitting: *Deleted

## 2017-09-08 ENCOUNTER — Ambulatory Visit: Payer: 59

## 2017-09-08 NOTE — Telephone Encounter (Signed)
DNKA bp check, needs to be called to come in asap. Is a postpartum pt.

## 2017-09-12 ENCOUNTER — Encounter: Payer: Self-pay | Admitting: *Deleted

## 2017-09-12 NOTE — Telephone Encounter (Signed)
Called [facility] and left a message we are calling to notify you that you missed an appointment for bp check, we do recommend that you call and reschedule for this week as soon as possilble .Will send letter .

## 2017-10-04 ENCOUNTER — Ambulatory Visit: Payer: 59 | Admitting: Student

## 2017-10-04 ENCOUNTER — Encounter: Payer: Self-pay | Admitting: General Practice

## 2019-12-17 ENCOUNTER — Emergency Department (HOSPITAL_COMMUNITY): Payer: 59

## 2019-12-17 ENCOUNTER — Other Ambulatory Visit: Payer: Self-pay

## 2019-12-17 ENCOUNTER — Encounter (HOSPITAL_COMMUNITY): Payer: Self-pay | Admitting: *Deleted

## 2019-12-17 ENCOUNTER — Emergency Department (HOSPITAL_COMMUNITY)
Admission: EM | Admit: 2019-12-17 | Discharge: 2019-12-17 | Disposition: A | Discharge status: Home / Self Care | Payer: 59 | Attending: Emergency Medicine | Admitting: Emergency Medicine

## 2019-12-17 DIAGNOSIS — R102 Pelvic and perineal pain: Secondary | ICD-10-CM | POA: Insufficient documentation

## 2019-12-17 LAB — COMPREHENSIVE METABOLIC PANEL
ALT: 18 U/L (ref 0–44)
AST: 20 U/L (ref 15–41)
Albumin: 4 g/dL (ref 3.5–5.0)
Alkaline Phosphatase: 58 U/L (ref 38–126)
Anion gap: 7 (ref 5–15)
BUN: 10 mg/dL (ref 6–20)
CO2: 27 mmol/L (ref 22–32)
Calcium: 8.9 mg/dL (ref 8.9–10.3)
Chloride: 104 mmol/L (ref 98–111)
Creatinine, Ser: 0.62 mg/dL (ref 0.44–1.00)
GFR calc Af Amer: >60 mL/min (ref >60)
GFR calc non Af Amer: >60 mL/min (ref >60)
Glucose, Bld: 84 mg/dL (ref 70–99)
Potassium: 3.8 mmol/L (ref 3.5–5.1)
Sodium: 138 mmol/L (ref 135–145)
Total Bilirubin: 0.7 mg/dL (ref 0.3–1.2)
Total Protein: 7.3 g/dL (ref 6.5–8.1)

## 2019-12-17 LAB — CBC
HCT: 43 % (ref 36.0–46.0)
Hemoglobin: 13.3 g/dL (ref 12.0–15.0)
MCH: 25.7 pg — ABNORMAL LOW (ref 26.0–34.0)
MCHC: 30.9 g/dL (ref 30.0–36.0)
MCV: 83.2 fL (ref 80.0–100.0)
Platelets: 258 10*3/uL (ref 150–400)
RBC: 5.17 MIL/uL — ABNORMAL HIGH (ref 3.87–5.11)
RDW: 14.6 % (ref 11.5–15.5)
WBC: 12 10*3/uL — ABNORMAL HIGH (ref 4.0–10.5)
nRBC: 0 % (ref 0.0–0.2)

## 2019-12-17 LAB — URINALYSIS, ROUTINE W REFLEX MICROSCOPIC
Bilirubin Urine: NEGATIVE
Glucose, UA: NEGATIVE mg/dL
Hgb urine dipstick: NEGATIVE
Ketones, ur: NEGATIVE mg/dL
Leukocytes,Ua: NEGATIVE
Nitrite: NEGATIVE
Protein, ur: NEGATIVE mg/dL
Specific Gravity, Urine: 1.02 (ref 1.005–1.030)
pH: 7 (ref 5.0–8.0)

## 2019-12-17 LAB — WET PREP, GENITAL
Clue Cells Wet Prep HPF POC: NONE SEEN
Sperm: NONE SEEN
Yeast Wet Prep HPF POC: NONE SEEN

## 2019-12-17 LAB — LIPASE, BLOOD: Lipase: 22 U/L (ref 11–51)

## 2019-12-17 LAB — I-STAT BETA HCG BLOOD, ED (MC, WL, AP ONLY): I-stat hCG, quantitative: <5 m[IU]/mL (ref <5)

## 2019-12-17 MED ORDER — SODIUM CHLORIDE 0.9% FLUSH: 3.0000 mL | Freq: Once | INTRAVENOUS | Status: DC

## 2019-12-17 MED ORDER — ACETAMINOPHEN 325 MG PO TABS
650.0000 mg | ORAL_TABLET | Freq: Once | ORAL | Status: AC
  Administered 2019-12-17: 650 mg via ORAL
  Filled 2019-12-17: qty 2

## 2019-12-17 MED ORDER — IOHEXOL 300 MG/ML  SOLN
100.0000 mL | Freq: Once | INTRAMUSCULAR | Status: AC | PRN
  Administered 2019-12-17: 19:00:00 100 mL via INTRAVENOUS

## 2019-12-17 NOTE — ED Provider Notes (Signed)
Kaitlyn Bailey is a 24 y.o. female, presenting to the ED with pelvic pain for the past several days, though she adds she has had pelvic pain on and off for the past several weeks.  At the time of my interview, patient was pain-free.  HPI from [facility], PA-C: "Kaitlyn Bailey is a 24 y.o. female with no significant past medical history who presents for evaluation of pelvic pain.  Initially started 3 days ago.  Pain resolved yesterday however returned this morning.  She feels her pain is at her cervix.  She is sexually active and does not use protection. She has IUD placed.  Patient states her pain was similar when she [facility].  She denies any fever, chills, nausea, vomiting, chest pain, shortness of breath, abdominal pain, diarrhea, dysuria, vaginal discharge, concerns for STDs. Denies additional aggravating or alleviating factors. Rates her pain a 6/10.  Denies pain located to her left or right lower quadrants.  She has been tolerating p.o. intake at home without difficulty."  Physical Exam  BP (!) 150/91 (BP Location: Right Arm)   Pulse 72   Temp 98.8 F (37.1 C) (Oral)   Resp 18   Ht 5\' 4"  (1.626 m)   Wt 107 kg   SpO2 100%   BMI 40.51 kg/m   Physical Exam Vitals and nursing note reviewed.  Constitutional:      General: She is not in acute distress.    Appearance: She is well-developed. She is not diaphoretic.  HENT:     Head: Normocephalic.     Mouth/Throat:     Mouth: Mucous membranes are moist.     Pharynx: Oropharynx is clear.  Eyes:     Conjunctiva/sclera: Conjunctivae normal.  Cardiovascular:     Rate and Rhythm: Normal rate and regular rhythm.     Pulses: Normal pulses.          Radial pulses are 2+ on the right side and 2+ on the left side.  Pulmonary:     Effort: Pulmonary effort is normal. No respiratory distress.  Abdominal:     Palpations: Abdomen is soft.     Tenderness: There is no abdominal  tenderness. There is no guarding.  Musculoskeletal:     Cervical back: Neck supple.     Right lower leg: No edema.     Left lower leg: No edema.  Skin:    General: Skin is warm and dry.  Neurological:     Mental Status: She is alert.  Psychiatric:        Mood and Affect: Mood and affect normal.        Speech: Speech normal.        Behavior: Behavior normal.     ED Course/Procedures     Procedures   Abnormal Labs Reviewed  WET PREP, GENITAL - Abnormal; Notable for the following components:      Result Value   Trich, Wet Prep   (*)    Value: Multiple bacterial morphotypes present, none predominant. Suggest appropriate recollection if clinically indicated.   WBC, Wet Prep HPF POC FEW (*)    All other components within normal limits  CBC - Abnormal; Notable for the following components:   WBC 12.0 (*)    RBC 5.17 (*)    MCH 25.7 (*)    All other components within normal limits   CT ABDOMEN PELVIS W CONTRAST  Result Date: 12/17/2019 CLINICAL DATA:  Pelvic pain negative beta HCG EXAM: CT ABDOMEN AND PELVIS WITH CONTRAST TECHNIQUE: Multidetector CT imaging of the abdomen and pelvis was performed using the standard protocol following bolus administration of intravenous contrast. CONTRAST:  OMNIPAQUE IOHEXOL 300 MG/ML  SOLN COMPARISON:  Ultrasound 12/17/2019 FINDINGS: Lower chest: No acute abnormality. Hepatobiliary: No focal liver abnormality is seen. No gallstones, gallbladder wall thickening, or biliary dilatation. Pancreas: Unremarkable. No pancreatic ductal dilatation or surrounding inflammatory changes. Spleen: Normal in size without focal abnormality. Adrenals/Urinary Tract: Adrenal glands are unremarkable. Kidneys are normal, without renal calculi, focal lesion, or hydronephrosis. Bladder is unremarkable. Stomach/Bowel: Stomach is within normal limits. Appendix appears normal. No evidence of bowel wall thickening, distention, or inflammatory changes. Vascular/Lymphatic: No  significant vascular findings are present. No enlarged abdominal or pelvic lymph nodes. Reproductive: Malpositioned IUD within the lower uterine segment and cervix. IUD is rotated with T prongs obliquely oriented with respect to the long axis of the cervix. No adnexal mass Other: No abdominal wall hernia or abnormality. No abdominopelvic ascites. Musculoskeletal: No acute or significant osseous findings. IMPRESSION: 1. No CT evidence for acute intra-abdominal or pelvic abnormality. 2. Malpositioned IUD within the lower uterine segment and cervix as described above. Electronically Signed   By: Jasmine Pang M.D.   On: 12/17/2019 19:03   US PELVIC COMPLETE WITH TRANSVAGINAL  Result Date: 12/17/2019 CLINICAL DATA:  Pelvic pain, IUD EXAM: TRANSABDOMINAL AND TRANSVAGINAL ULTRASOUND OF PELVIS TECHNIQUE: Both transabdominal and transvaginal ultrasound examinations of the pelvis were performed. Transabdominal technique was performed for global imaging of the pelvis including uterus, ovaries, adnexal regions, and pelvic cul-de-sac. It was necessary to proceed with endovaginal exam following the transabdominal exam to visualize the endometrial cavity. COMPARISON:  None FINDINGS: Uterus Measurements: 7.7 x 3.3 by 5.1 cm = volume: 68 mL. The IUD is identified within the lower uterine segment extending into the cervical canal. Endometrium Thickness: 5 mm.  No focal abnormality visualized. Right ovary Measurements: 2.4 x 2.3 x 2.8 cm = volume: 8.1 mL. Normal follicles identified. Left ovary Measurements: 2.5 x 3.0 x 1.8 cm = volume: 7.1 mL. Normal appearance/no adnexal mass. Other findings No abnormal free fluid. IMPRESSION: 1. IUD abnormally located within the lower uterine segment and cervix. 2. Otherwise age-appropriate pelvic ultrasound. Electronically Signed   By: Sharlet Salina M.D.   On: 12/17/2019 17:53    MDM   Clinical Course as of Dec 17 2326  Mon Dec 17, 2019  2015 Spoke with Dr. Debroah Loop, OBGYN.  We  discussed the patient's reported symptoms as well as her imaging studies. He states patient can be seen in the office this week. She will not need any additional birth control, her IUD should still be effective.   [SJ]    Clinical Course User Index [SJ] Anselm Pancoast, PA-C   Patient care handoff report received from [facility], PA-C. Plan: Review CT results.  Contact OB/GYN for advice and follow-up plan.  Patient presents with intermittent pelvic pain.  She has been controlling her pain with Tylenol.  She did not have any reported abnormalities on pelvic exam. Her IUD seems to be out of place on ultrasound and CT, though no other acute abnormalities were identified on these imaging studies. I offered the patient additional analgesic options, but she declined.  I also offered her a prescription for Zofran with an explanation, but she declined this as well. She will follow-up with OB/GYN in the office this week. Return precautions discussed.  Patient voices understanding of  these instructions, accepts the plan, and is comfortable with discharge.  I reviewed and interpreted the patient's labs and radiological studies.  Vitals:   12/17/19 1247 12/17/19 1252 12/17/19 1608  BP: 134/68  (!) 150/91  Pulse: 72  72  Resp: 16  18  Temp: 98.8 F (37.1 C)    TempSrc: Oral    SpO2: 100%  100%  Weight: 107 kg 107 kg   Height: 5\' 4"  (1.626 m) 5\' 4"  (1.626 m)       Lorayne Bender, PA-C 12/17/19 2328    Lucrezia Starch, MD 12/19/19 1256

## 2019-12-17 NOTE — ED Notes (Signed)
Dr. Debroah Loop transferred to [facility] per his request

## 2019-12-17 NOTE — ED Provider Notes (Addendum)
[name] EMERGENCY DEPARTMENT Provider Note   CSN: [id number] Arrival date & time: 12/17/19  1203    History Chief Complaint  Patient presents with  . Abdominal Pain    Kaitlyn Bailey is a 24 y.o. female with no significant past medical history who presents for evaluation of pelvic pain.  Initially started 3 days ago.  Pain resolved yesterday however returned this morning.  She feels her pain is at her cervix.  She is sexually active and does not use protection. She has IUD placed.  Patient states her pain was similar when she [facility].  She denies any fever, chills, nausea, vomiting, chest pain, shortness of breath, abdominal pain, diarrhea, dysuria, vaginal discharge, concerns for STDs. Denies additional aggravating or alleviating factors. Rates her pain a 6/10.  Denies pain located to her left or right lower quadrants.  She has been tolerating p.o. intake at home without difficulty.   History obtained from patient and past medical records.  No interpreter is used.  HPI    Past Medical History:  Diagnosis Date  . Depression     Patient Active Problem List   Diagnosis Date Noted  . Gestational hypertension without significant proteinuria, affecting childbirth 08/25/2017    History reviewed. No pertinent surgical history.   OB History    Gravida  2   Para  1   Term  1   Preterm      AB  1   Living  1     SAB      TAB      Ectopic      Multiple  0   Live Births  1           No family history on file.  Social History   Tobacco Use  . Smoking status: Never Smoker  . Smokeless tobacco: Never Used  Substance Use Topics  . Alcohol use: Yes  . Drug use: No    Home Medications Prior to Admission medications   Medication Sig Start Date End Date Taking? Authorizing Provider  acetaminophen (TYLENOL) 500 MG tablet Take 500 mg by mouth every 6 (six) hours as needed for moderate  pain.    [provider]  calcium carbonate (TUMS - DOSED IN MG ELEMENTAL CALCIUM) 500 MG chewable tablet Chew 1 tablet by mouth 2 (two) times daily as needed for indigestion or heartburn.    [provider]  ibuprofen (ADVIL,MOTRIN) 600 MG tablet Take 1 tablet (600 mg total) by mouth every 6 (six) hours. 08/29/17   Wende Mott, CNM  Prenatal Vit-Fe Fumarate-FA (PRENATAL COMPLETE) 14-0.4 MG TABS Take 1 tablet by mouth daily after breakfast. 04/08/17   [address], [place], [place]    Allergies    Patient has no known allergies.  Review of Systems   Review of Systems  Constitutional: Negative.   HENT: Negative.   Respiratory: Negative.   Cardiovascular: Negative.   Gastrointestinal: Negative.   Genitourinary: Positive for pelvic pain. Negative for decreased urine volume, difficulty urinating, dysuria, flank pain, frequency, urgency, vaginal bleeding, vaginal discharge and vaginal pain.  Musculoskeletal: Negative.   Skin: Negative.   Neurological: Negative.   All other systems reviewed and are negative.  Physical Exam Updated Vital Signs BP (!) 150/91 (BP Location: Right Arm)   Pulse 72   Temp 98.8 F (37.1 C) (Oral)   Resp 18   Ht 5\' 4"  (1.626 m)   Wt  107 kg   SpO2 100%   BMI 40.51 kg/m   Physical Exam Vitals and nursing note reviewed.  Constitutional:      General: She is not in acute distress.    Appearance: She is well-developed. She is not ill-appearing, toxic-appearing or diaphoretic.  HENT:     Head: Normocephalic and atraumatic.     Mouth/Throat:     Mouth: Mucous membranes are moist.  Eyes:     Pupils: Pupils are equal, round, and reactive to light.  Cardiovascular:     Rate and Rhythm: Normal rate.     Heart sounds: Normal heart sounds.  Pulmonary:     Effort: Pulmonary effort is normal. No respiratory distress.     Breath sounds: Normal breath sounds.  Abdominal:     General: There is no distension.     Palpations: Abdomen is soft.      Tenderness: There is no abdominal tenderness. There is no right CVA tenderness, left CVA tenderness, guarding or rebound. Negative signs include Murphy's sign and McBurney's sign.     Hernia: No hernia is present.  Genitourinary:    Comments: Normal appearing external female genitalia without rashes or lesions, normal vaginal epithelium. Normal appearing cervix with moderate white discharge. No cervical petechiae. Cervical os is closed. There is no bleeding noted at the os.No odor. Bimanual: No CMT, nontender.  No palpable adnexal masses or tenderness. Uterus midline and not fixed. Rectovaginal exam was deferred.  No cystocele or rectocele noted. No pelvic lymphadenopathy noted. Wet prep was obtained.  Cultures for gonorrhea and chlamydia collected. Exam performed with chaperone in room. Could not visualize or palpate  IUD string Musculoskeletal:        General: Normal range of motion.     Cervical back: Normal range of motion.  Skin:    General: Skin is warm and dry.     Capillary Refill: Capillary refill takes less than 2 seconds.     Comments: Brisk cap refill. No edema, erythema, warmth. No fluctuance or induration.   Neurological:     General: No focal deficit present.     Mental Status: She is alert.     Gait: Gait is intact.    ED Results / Procedures / Treatments   Labs (all labs ordered are listed, but only abnormal results are displayed) Labs Reviewed  WET PREP, GENITAL - Abnormal; Notable for the following components:      Result Value   Trich, Wet Prep   (*)    Value: Multiple bacterial morphotypes present, none predominant. Suggest appropriate recollection if clinically indicated.   WBC, Wet Prep HPF POC FEW (*)    All other components within normal limits  CBC - Abnormal; Notable for the following components:   WBC 12.0 (*)    RBC 5.17 (*)    MCH 25.7 (*)    All other components within normal limits  LIPASE, BLOOD  COMPREHENSIVE METABOLIC PANEL  URINALYSIS, ROUTINE W  REFLEX MICROSCOPIC  I-STAT BETA HCG BLOOD, ED ([facility], [facility], [facility] ONLY)  GC/CHLAMYDIA PROBE AMP ([place]) NOT AT [facility]    EKG None  Radiology US PELVIC COMPLETE WITH TRANSVAGINAL  Result Date: 12/17/2019 CLINICAL DATA:  Pelvic pain, IUD EXAM: TRANSABDOMINAL AND TRANSVAGINAL ULTRASOUND OF PELVIS TECHNIQUE: Both transabdominal and transvaginal ultrasound examinations of the pelvis were performed. Transabdominal technique was performed for global imaging of the pelvis including uterus, ovaries, adnexal regions, and pelvic cul-de-sac. It was necessary to proceed with endovaginal exam following the transabdominal  exam to visualize the endometrial cavity. COMPARISON:  None FINDINGS: Uterus Measurements: 7.7 x 3.3 by 5.1 cm = volume: 68 mL. The IUD is identified within the lower uterine segment extending into the cervical canal. Endometrium Thickness: 5 mm.  No focal abnormality visualized. Right ovary Measurements: 2.4 x 2.3 x 2.8 cm = volume: 8.1 mL. Normal follicles identified. Left ovary Measurements: 2.5 x 3.0 x 1.8 cm = volume: 7.1 mL. Normal appearance/no adnexal mass. Other findings No abnormal free fluid. IMPRESSION: 1. IUD abnormally located within the lower uterine segment and cervix. 2. Otherwise age-appropriate pelvic ultrasound. Electronically Signed   By: Sharlet Salina M.D.   On: 12/17/2019 17:53    Procedures Procedures (including critical care time)  Medications Ordered in ED Medications  sodium chloride flush (NS) 0.9 % injection 3 mL (has no administration in time range)  acetaminophen (TYLENOL) tablet 650 mg (650 mg Oral Given 12/17/19 1806)   ED Course  I have reviewed the triage vital signs and the nursing notes.  Pertinent labs & imaging results that were available during my care of the patient were reviewed by me and considered in my medical decision making (see chart for details).  24 year old female appears otherwise well presents for evaluation of pelvic pain.  She is  afebrile, nonseptic, non-ill-appearing.  She has no concerns for STDs.  States pain is similar to when she had her [facility]UD [facility] in [facility] the [facility].  She has no discharge or vaginal bleeding.  No urinary complaints.  She has no focal right or left lower quadrant abdominal pain.  Abdomen soft, nontender.  Heart and lungs clear.  Labs obtained from triage  Labs and imaging personally reviewed and interpreted: Pregnancy negative Lipase 22 CBC with leukocytosis at 12 Metabolic panel without electrolyte, renal abnormality Urinalysis negative for infection Wet prep with possible trichomonas, multiple species, few WBC  Patient reassessed. Does not need pain medication currently. US shows abnormally located IUD in lower uterine segment and cervix.   GU exam without CMT, adnexal tenderness. Low suspicion for PID, torsion, TOA  CONSULT with lab states no definitive Trich however multiple species and morphologies presents. Culture sent. Given no dc will hold on abx currently.  Patient reassessed. Will obtain CT for better location and likely consult with Ob/ Gyn for disposition and follow up.  Care transferred to [facility], PA-C who will follow up on imaging and determine ultimate disposition.    MDM Rules/Calculators/A&P                       Final Clinical Impression(s) / ED Diagnoses Final diagnoses:  Pelvic pain    Rx / DC Orders ED Discharge Orders    None       Kaitlynd Phillips A, PA-C 12/17/19 1829    Tyjah Hai A, PA-C 12/17/19 1835    Cathren Laine, MD 12/17/19 1958    Chinaza Rooke A, PA-C 12/17/19 2009    Cathren Laine, MD 12/20/19 1042

## 2019-12-17 NOTE — Discharge Instructions (Signed)
Call the OB/GYN clinic.  They should be expecting your call.  Set up an appointment with them.  Antiinflammatory medications: Take 600 mg of ibuprofen every 6 hours or 440 mg (over the counter dose) to 500 mg (prescription dose) of naproxen every 12 hours for the next 3 days. After this time, these medications may be used as needed for pain. Take these medications with food to avoid upset stomach. Choose only one of these medications, do not take them together. Acetaminophen (generic for Tylenol): Should you continue to have additional pain while taking the ibuprofen or naproxen, you may add in acetaminophen as needed. Your daily total maximum amount of acetaminophen from all sources should be limited to 4000mg /day for persons without liver problems, or 2000mg /day for those with liver problems.

## 2019-12-17 NOTE — ED Triage Notes (Signed)
C/o lower abd. Pain concerned she may be having problems with her IUD, c/o onset sat go better and returned today worse

## 2019-12-18 LAB — GC/CHLAMYDIA PROBE AMP (~~LOC~~) NOT AT ARMC
Chlamydia: NEGATIVE
Comment: NEGATIVE
Comment: NORMAL
Neisseria Gonorrhea: NEGATIVE

## 2020-01-18 ENCOUNTER — Ambulatory Visit: Payer: 59 | Admitting: Obstetrics & Gynecology

## 2021-11-14 IMAGING — CT CT ABD-PELV W/ CM
2 of 4 series · 16 of 46 positions shown, 18 images · IV contrast (APPLIED)
Comparison: Ultrasound 12/17/2019

CLINICAL DATA: Pelvic pain negative beta HCG

EXAM:
CT ABDOMEN AND PELVIS WITH CONTRAST
TECHNIQUE: Multidetector CT imaging of the abdomen and pelvis was performed
using the standard protocol following bolus administration of
intravenous contrast.
CONTRAST:  100mL OMNIPAQUE IOHEXOL 300 MG/ML  SOLN

[Series 3: abdomen 5.0 · axial · 0.80mm/px · z∈[+766,+1216]mm · 13 of 104 slices shown, 15 images]
[im 7/104  soft-tissue]
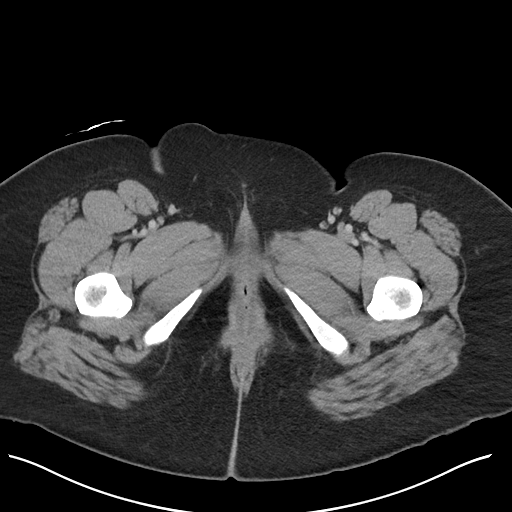
[im 7/104  bone]
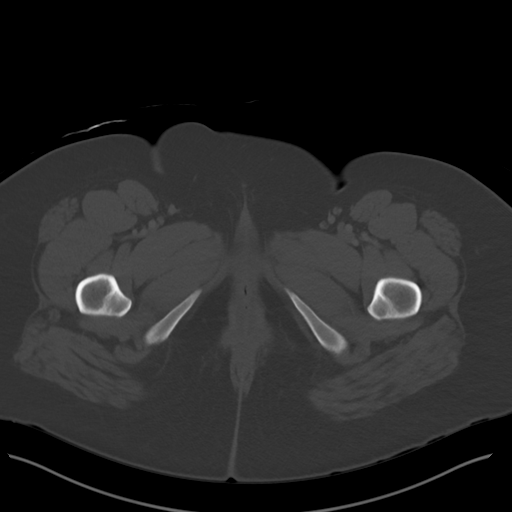
[im 13/104  soft-tissue]
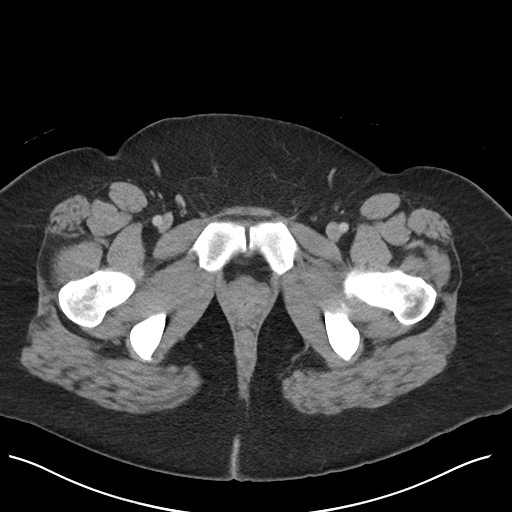
[im 25/104  soft-tissue]
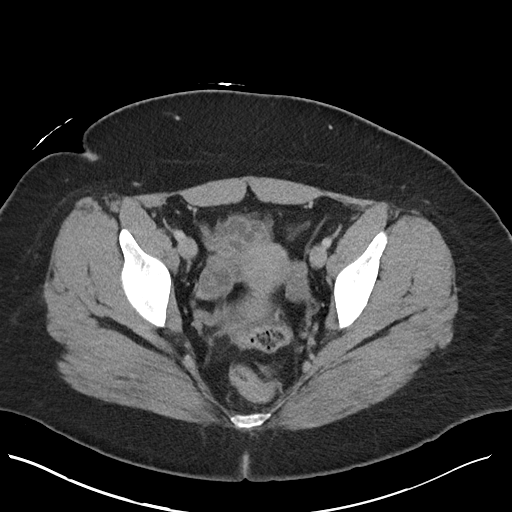
[im 31/104  soft-tissue]
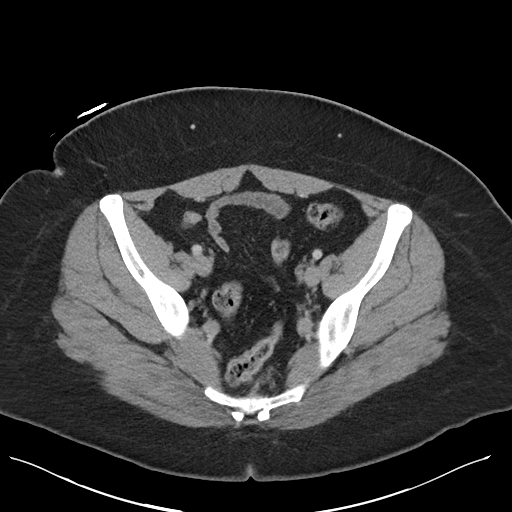
[im 37/104  soft-tissue]
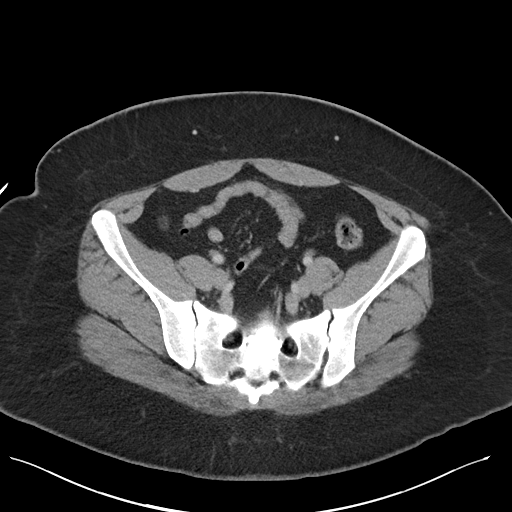
[im 43/104  soft-tissue]
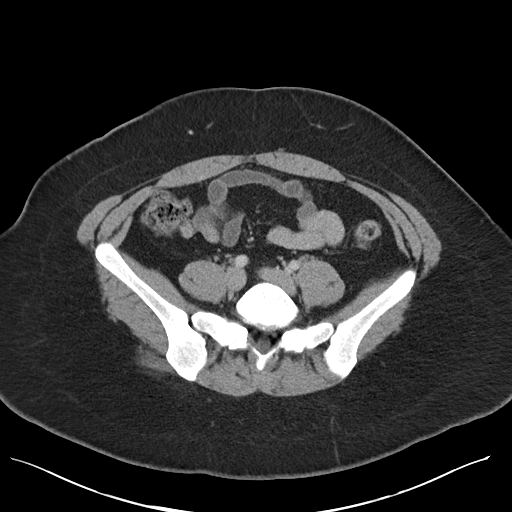
[im 55/104  soft-tissue]
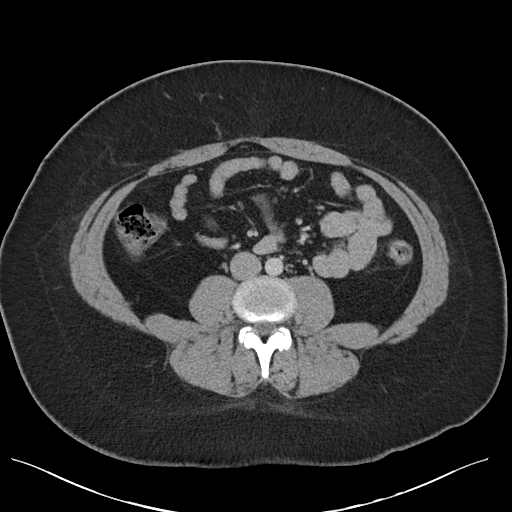
[im 61/104  soft-tissue]
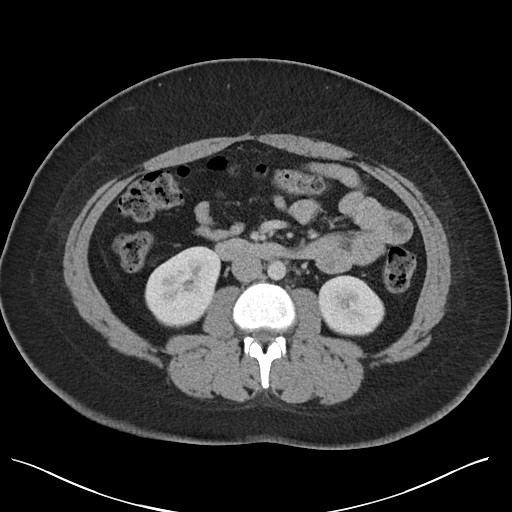
[im 67/104  soft-tissue]
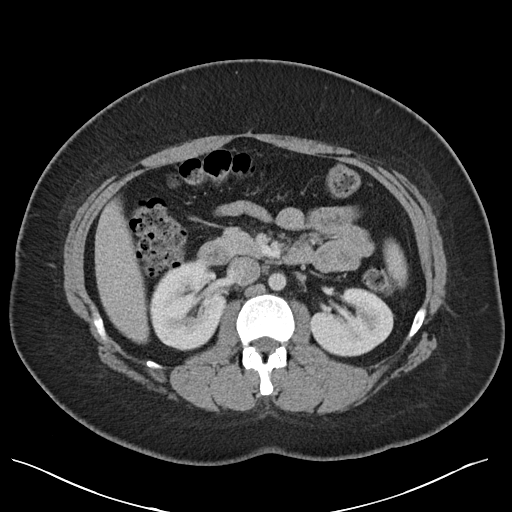
[im 67/104  bone]
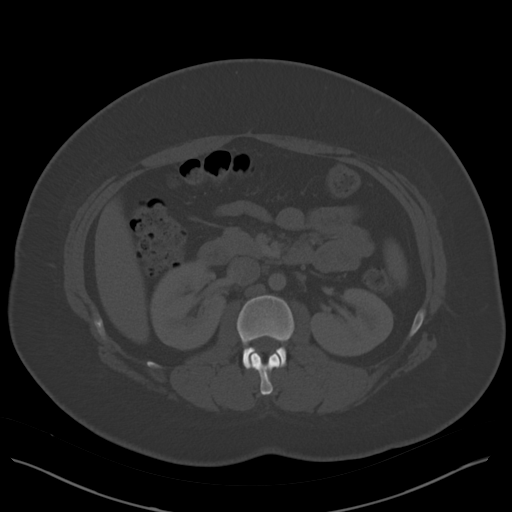
[im 73/104  soft-tissue]
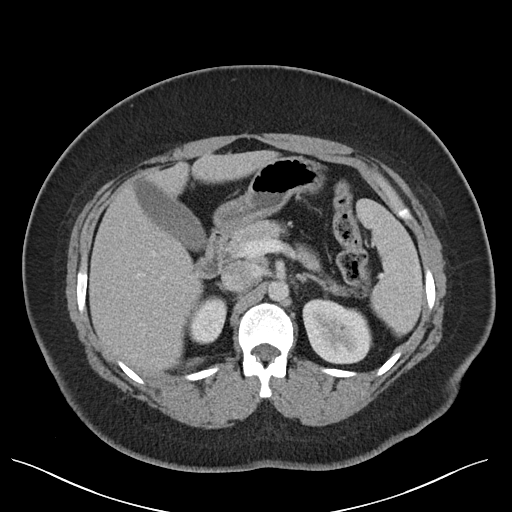
[im 79/104  soft-tissue]
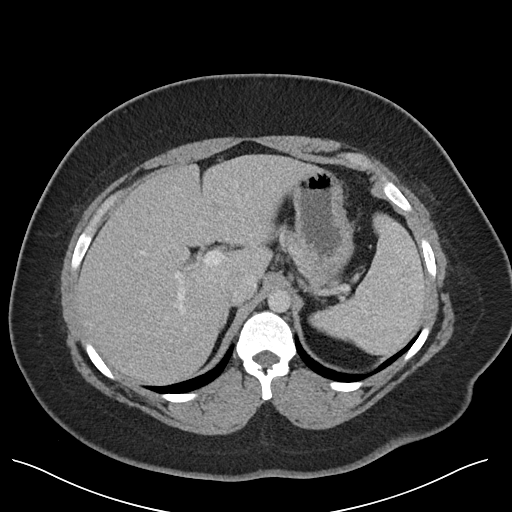
[im 91/104  soft-tissue]
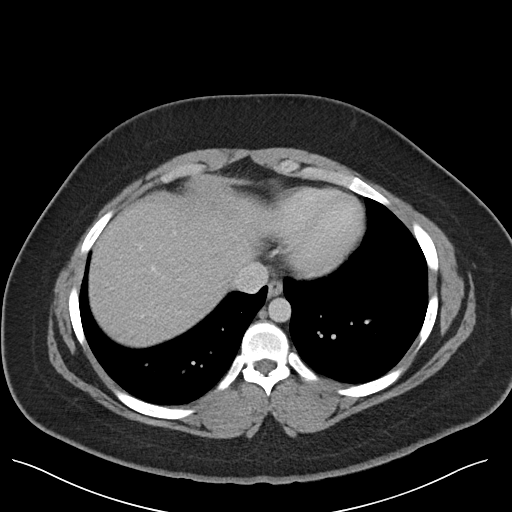
[im 97/104  soft-tissue]
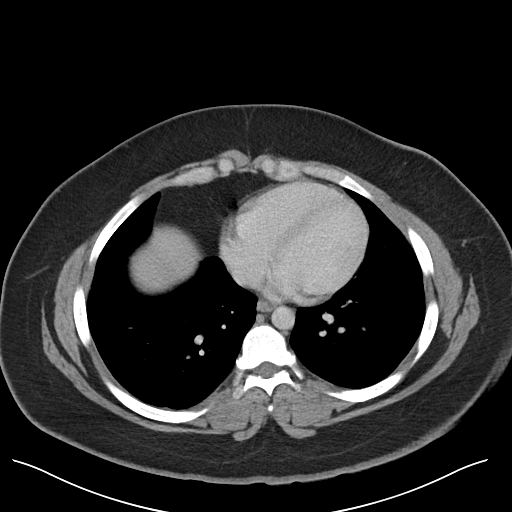

[Series 6: abdomen 3.0 mpr cor · coronal · 0.77mm/px · 3 of 115 slices shown]
[im 39/115  soft-tissue]
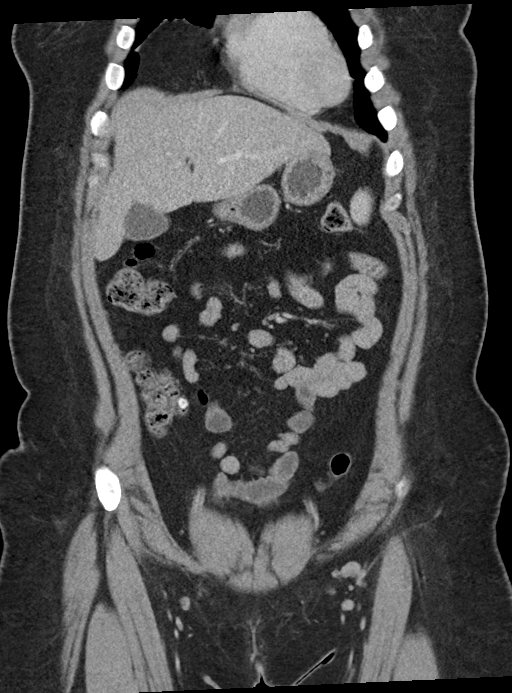
[im 51/115  soft-tissue]
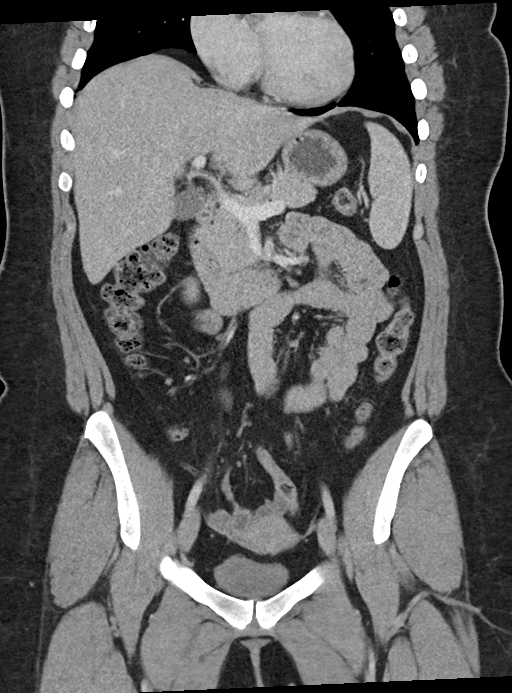
[im 64/115  soft-tissue]
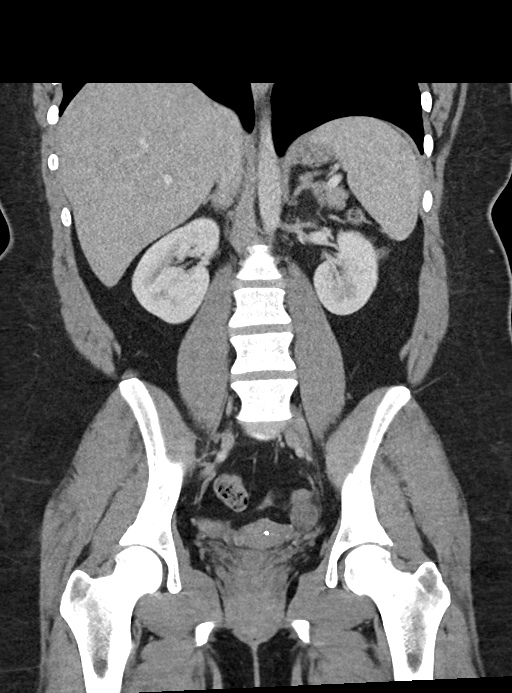

[16 of 46 positions shown; findings below may reference images not displayed]

FINDINGS: Lower chest: No acute abnormality.

Hepatobiliary: No focal liver abnormality is seen. No gallstones,
gallbladder wall thickening, or biliary dilatation.

Pancreas: Unremarkable. No pancreatic ductal dilatation or
surrounding inflammatory changes.

Spleen: Normal in size without focal abnormality.

Adrenals/Urinary Tract: Adrenal glands are unremarkable. Kidneys are
normal, without renal calculi, focal lesion, or hydronephrosis.
Bladder is unremarkable.

Stomach/Bowel: Stomach is within normal limits. Appendix appears
normal. No evidence of bowel wall thickening, distention, or
inflammatory changes.

Vascular/Lymphatic: No significant vascular findings are present. No
enlarged abdominal or pelvic lymph nodes.

Reproductive: Malpositioned IUD within the lower uterine segment and
cervix. IUD is rotated with T prongs obliquely oriented with respect
to the long axis of the cervix. No adnexal mass

Other: No abdominal wall hernia or abnormality. No abdominopelvic
ascites.

Musculoskeletal: No acute or significant osseous findings.
IMPRESSION: 1. No CT evidence for acute intra-abdominal or pelvic abnormality.
2. Malpositioned IUD within the lower uterine segment and cervix as
described above.
